# Patient Record
Sex: Female | Born: 2000
Health system: Southern US, Community
[De-identification: ages and names within clinical notes are randomized; demographics above are authoritative.]

## PROBLEM LIST (undated history)

## (undated) HISTORY — PX: NO PAST SURGERIES: SHX2092

---
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
## 2018-06-17 ENCOUNTER — Ambulatory Visit: Payer: Self-pay | Attending: Family Medicine | Admitting: Family Medicine

## 2018-06-17 ENCOUNTER — Encounter: Payer: Self-pay | Admitting: Family Medicine

## 2018-06-17 ENCOUNTER — Other Ambulatory Visit: Payer: Self-pay

## 2018-06-17 VITALS — BP 110/78 | HR 56 | Temp 98.6°F | Resp 18 | Ht 64.0 in | Wt 130.2 lb

## 2018-06-17 DIAGNOSIS — R42 Dizziness and giddiness: Secondary | ICD-10-CM

## 2018-06-17 DIAGNOSIS — Z23 Encounter for immunization: Secondary | ICD-10-CM

## 2018-06-17 DIAGNOSIS — R630 Anorexia: Secondary | ICD-10-CM

## 2018-06-17 NOTE — Patient Instructions (Signed)

## 2018-06-17 NOTE — Progress Notes (Signed)
Subjective:    Patient ID: Jennifer Maxwell, female    DOB: 02-06-2001, 17 y.o.   MRN: [id number]  HPI  17 yo female new to the practice. Patient presents to establish care. Patient states that she visited her older sister in [place] from May through June (stayed for 3 months and returned on July 31st). Patient has noticed since she returned that she is usually not hungry. Patient states that she does not have an appetite. Patient has also noticed some dizziness at times with changes in position such as from sitting to standing.       When patient initially arrived in [place] she started having daily headaches and her sister took her to the doctor. Patient states that she was told that her headaches were due to dehydration and that she was not used to the heat. Patient did not have any GI issues while in [place]. Patient denies any abdominal pain. No nausea, vomiting, diarrhea or constipation. Patient thinks that she has lost weight but she is not sure over what time period or how much but she knows that this time last year she was 160 pounds. Patient denies any anxiety or depression. She does not smoke cigarettes but occasionally uses a vape pen. 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  Patient denies any significant medical history other than possible grandparent with diabetes.  Patient denies any prior surgery  Review of Systems  Constitutional: Positive for appetite change, fatigue (mild fatigue) and unexpected weight change.  HENT: Negative for sore throat and trouble swallowing.   Respiratory: Negative for cough and shortness of breath.   Cardiovascular: Negative for chest pain, palpitations and leg swelling.  Gastrointestinal: Negative for abdominal pain and nausea.  Genitourinary: Negative for dysuria and frequency.  Musculoskeletal: Negative for back pain, gait problem and myalgias.  Neurological: Positive for  dizziness. Negative for headaches.       Objective:   Physical Exam  Constitutional: She is oriented to person, place, and time. She appears well-developed and well-nourished. No distress.  HENT:  Right Ear: Tympanic membrane, external ear and ear canal normal.  Left Ear: Tympanic membrane, external ear and ear canal normal.  Nose: Nose normal.  Mouth/Throat: Oropharynx is clear and moist.  Eyes: Pupils are equal, round, and reactive to light. Conjunctivae and EOM are normal.  Cardiovascular: Normal rate and regular rhythm.  Pulmonary/Chest: Effort normal and breath sounds normal.  Abdominal: Soft. Bowel sounds are normal. There is no tenderness.  Musculoskeletal: Normal range of motion. She exhibits no edema or tenderness.  Neurological: She is alert and oriented to person, place, and time. No cranial nerve deficit.  Skin:  Normal, no rash or skin changes  Vitals reviewed. BP 110/78 (BP Location: Left Arm, Patient Position: Sitting, Cuff Size: Normal)   Pulse (!) 56   Temp 98.6 F (37 C) (Oral)   Resp 18   Ht 5\' 4"  (1.626 m)   Wt 130 lb 3.2 oz (59.1 kg)   SpO2 100%   BMI 22.35 kg/m       Assessment & Plan:  1. Dizziness Patient with complaint of some dizziness as well as loss of appetite since returning from visiting her sister in [place].  Patient states that while she was in [place] she was diagnosed with dehydration.  Patient will have CMP to look for liver or electrolyte disorder, CBC to look for anemia or possible infection of white blood  cell count is elevated and patient will have TSH in follow-up of loss of appetite.  Patient will be notified of the results and if any further follow-up is needed based on the results.  Patient should return to clinic of her dizziness does not resolve over the next few weeks.  Discussed drinking fluids such as water as well as a mixture of water and Gatorade to help replace lost fluids/electrolytes. - Comprehensive metabolic panel - CBC  with Differential - TSH  2. Loss of appetite for more than 2 weeks Patient with complaint of loss of appetite/decreased appetite since returning from [place].  Patient denies any other GI symptoms.  Patient states that she was seen by Dr. in [place] secondary to headache and was diagnosed with heat illness/dehydration.  Patient has had no nausea/vomiting/diarrhea or constipation.  No heartburn and no abdominal pain.  Patient will have blood work including CMP, CBC and TSH in follow-up of her loss of appetite.  Patient should monitor her weight and return to clinic for weight recheck.  Patient is reminded to try and eat on a regular schedule at least every 6 hours but should return to clinic if she has continued loss of appetite and weight loss.  Patient with occasional use of a vaping pen and patient was encouraged to completely abstain from use of this device. - Comprehensive metabolic panel - CBC with Differential - TSH  3. Need for immunization against influenza Patient was offered influenza immunization which she agreed to receive at today's visit and patient was given information regarding the vaccine in a handout as well.  - Flu Vaccine QUAD 36+ mos IM  An After Visit Summary was printed and given to the patient.  Return if symptoms worsen or fail to improve, for follow-up if not feeling better; schedule well exam at your convience.

## 2018-06-18 LAB — CBC WITH DIFFERENTIAL/PLATELET
Basophils Absolute: 0.1 x10E3/uL (ref 0.0–0.2)
Basos: 1 %
EOS (ABSOLUTE): 0.2 x10E3/uL (ref 0.0–0.4)
Eos: 3 %
Hematocrit: 42.2 % (ref 34.0–46.6)
Hemoglobin: 14.3 g/dL (ref 11.1–15.9)
Immature Grans (Abs): 0 x10E3/uL (ref 0.0–0.1)
Immature Granulocytes: 0 %
Lymphocytes Absolute: 3.1 x10E3/uL (ref 0.7–3.1)
Lymphs: 44 %
MCH: 30.6 pg (ref 26.6–33.0)
MCHC: 33.9 g/dL (ref 31.5–35.7)
MCV: 90 fL (ref 79–97)
Monocytes Absolute: 0.4 x10E3/uL (ref 0.1–0.9)
Monocytes: 6 %
Neutrophils Absolute: 3.2 x10E3/uL (ref 1.4–7.0)
Neutrophils: 46 %
Platelets: 199 x10E3/uL (ref 150–450)
RBC: 4.67 x10E6/uL (ref 3.77–5.28)
RDW: 13.6 % (ref 12.3–15.4)
WBC: 7 x10E3/uL (ref 3.4–10.8)

## 2018-06-18 LAB — COMPREHENSIVE METABOLIC PANEL WITH GFR
ALT: 10 IU/L (ref 0–32)
AST: 18 IU/L (ref 0–40)
Albumin/Globulin Ratio: 2 (ref 1.2–2.2)
Albumin: 5.2 g/dL (ref 3.5–5.5)
Alkaline Phosphatase: 65 IU/L (ref 43–101)
BUN/Creatinine Ratio: 12 (ref 9–23)
BUN: 8 mg/dL (ref 6–20)
Bilirubin Total: 0.5 mg/dL (ref 0.0–1.2)
CO2: 19 mmol/L — ABNORMAL LOW (ref 20–29)
Calcium: 10 mg/dL (ref 8.7–10.2)
Chloride: 104 mmol/L (ref 96–106)
Creatinine, Ser: 0.65 mg/dL (ref 0.57–1.00)
GFR calc Af Amer: 150 mL/min/1.73
GFR calc non Af Amer: 130 mL/min/1.73
Globulin, Total: 2.6 g/dL (ref 1.5–4.5)
Glucose: 78 mg/dL (ref 65–99)
Potassium: 4.2 mmol/L (ref 3.5–5.2)
Sodium: 142 mmol/L (ref 134–144)
Total Protein: 7.8 g/dL (ref 6.0–8.5)

## 2018-06-18 LAB — TSH: TSH: 5.08 u[IU]/mL — ABNORMAL HIGH (ref 0.450–4.500)

## 2018-06-22 ENCOUNTER — Telehealth: Payer: Self-pay

## 2018-06-22 NOTE — Telephone Encounter (Signed)
[employer] interpreter Angelica [id number]. Call was placed to patient, no answer. Interpreter lvm for patient to return call. If patient returns call please  Notify patient that her CMP and CBC are normal. She has a very mild increase in her TSH but since it is very near normal, I would suggest just repeating the TSH in about 6 months. No treatment warranted based on today's result.

## 2018-06-22 NOTE — Telephone Encounter (Signed)
-----   Message from Cain Saupeammie Fulp, MD sent at 06/20/2018  6:52 PM EDT ----- Notify patient that her CMP and CBC are normal. She has a very mild increase in her TSH but since it is very near normal, I would suggest just repeating the TSH in about 6 months. No treatment warranted based on today's result.

## 2018-12-08 ENCOUNTER — Inpatient Hospital Stay (HOSPITAL_COMMUNITY): Admit: 2018-12-08 | Payer: Self-pay

## 2018-12-08 ENCOUNTER — Ambulatory Visit: Payer: Self-pay | Attending: Family Medicine | Admitting: Physician Assistant

## 2018-12-08 VITALS — BP 107/73 | HR 73 | Temp 98.8°F | Resp 16 | Ht 64.0 in | Wt 138.0 lb

## 2018-12-08 DIAGNOSIS — R319 Hematuria, unspecified: Secondary | ICD-10-CM

## 2018-12-08 DIAGNOSIS — N3000 Acute cystitis without hematuria: Secondary | ICD-10-CM

## 2018-12-08 LAB — POCT URINALYSIS DIP (CLINITEK)
BILIRUBIN UA: NEGATIVE mg/dL
Bilirubin, UA: NEGATIVE
GLUCOSE UA: NEGATIVE mg/dL
Leukocytes, UA: NEGATIVE
Nitrite, UA: NEGATIVE
POC PROTEIN,UA: NEGATIVE
Spec Grav, UA: <=1.005 — AB (ref 1.010–1.025)
Urobilinogen, UA: 0.2 E.U./dL
pH, UA: 6 (ref 5.0–8.0)

## 2018-12-08 NOTE — Patient Instructions (Signed)
Drink 80-100 ounces of water daily

## 2018-12-08 NOTE — Progress Notes (Signed)
Patient would like to check to see if she still have UTI. Pt. Stated she feels uncomfortable.   Pt. Stated she finished her Antibiotic she got from the urgent for UTI.

## 2018-12-08 NOTE — Progress Notes (Signed)
Patient ID: Jennifer Maxwell, female   DOB: 2001-04-15, 18 y.o.   MRN: [id number]      Jennifer Maxwell, is a 18 y.o. female  HOO:[id number]  [id number]  DOB - 08-23-2001  Subjective:  Chief Complaint and HPI: Jennifer Maxwell is a 18 y.o. female here today for a follow up visit after going to a non-[facility]gent [facility] and treated for UTI.  She was given 5 days of antibiotics.  Finished the meds 6 days ago.  Says things "still don't feel right."  She doesn't know which urgent care she went to or what meds she was given.  She denies dysuria and says that overall her symptoms are gone.  LMP about 2.5 weeks ago.  No f/c.  No pelvic pain.  No abdominal pain.  No concern for STI.  No vaginal discharge  ROS:   Constitutional:  No f/c, No night sweats, No unexplained weight loss. EENT:  No vision changes, No blurry vision, No hearing changes. No mouth, throat, or ear problems.  Respiratory: No cough, No SOB Cardiac: No CP, no palpitations GI:  No abd pain, No N/V/D. GU: see above Musculoskeletal: No joint pain Neuro: No headache, no dizziness, no motor weakness.  Skin: No rash Endocrine:  No polydipsia. No polyuria.  Psych: Denies SI/HI  No problems updated.  ALLERGIES: Not on File  PAST MEDICAL HISTORY: History reviewed. No pertinent past medical history.  MEDICATIONS AT HOME: Prior to Admission medications   Medication Sig Start Date End Date Taking? Authorizing Provider  ibuprofen (ADVIL,MOTRIN) 200 MG tablet Take 200 mg by mouth every 6 (six) hours as needed.   Yes [provider]     Objective:  EXAM:   Vitals:   12/08/18 0849  BP: 107/73  Pulse: 73  Resp: 16  Temp: 98.8 F (37.1 C)  TempSrc: Oral  SpO2: 97%  Weight: 138 lb (62.6 kg)  Height: 5\' 4"  (1.626 m)    General appearance : A&OX3. NAD. Non-toxic-appearing HEENT: Atraumatic and Normocephalic.  PERRLA. EOM intact.   Chest/Lungs:  Breathing-non-labored, Good air  entry bilaterally, breath sounds normal without rales, rhonchi, or wheezing  CVS: S1 S2 regular, no murmurs, gallops, rubs  No CVA TTP Extremities: Bilateral Lower Ext shows no edema, both legs are warm to touch with = pulse throughout Neurology:  CN II-XII grossly intact, Non focal.   Psych:  TP linear. J/I WNL. Normal speech. Appropriate eye contact and affect.  Skin:  No Rash  Data Review No results found for: HGBA1C   Assessment & Plan   1. Hematuria, unspecified type Will assess for further infection-she may be developing yeast infection s/p antibiotics.  Will await test results.  Drink 80-100 ounces water daily.   - POCT URINALYSIS DIP (CLINITEK) - Urine cytology ancillary only - Urine Culture     Patient have been counseled extensively about nutrition and exercise  Return if symptoms worsen or fail to improve.  The patient was given clear instructions to go to ER or return to medical center if symptoms don't improve, worsen or new problems develop. The patient verbalized understanding. The patient was told to call to get lab results if they haven't heard anything in the next week.     Georgian Co, PA-C [facility] [place], [place] [phone]   12/08/2018, 9:10 AM

## 2018-12-09 LAB — URINE CYTOLOGY ANCILLARY ONLY
Chlamydia: NEGATIVE
Neisseria Gonorrhea: NEGATIVE
Trichomonas: NEGATIVE

## 2018-12-10 LAB — URINE CULTURE

## 2018-12-13 ENCOUNTER — Telehealth: Payer: Self-pay | Admitting: *Deleted

## 2018-12-13 LAB — URINE CYTOLOGY ANCILLARY ONLY
Bacterial vaginitis: NEGATIVE
Candida vaginitis: NEGATIVE

## 2018-12-13 NOTE — Telephone Encounter (Signed)
-----   Message from Anders Simmonds, [place] sent at 12/13/2018 12:12 PM EST ----- Please call patient.  All urine test was negative for STD infection, yeast, BV, or any other infection.  Follow-up as planned.  Thanks, Anne Ng

## 2018-12-13 NOTE — Telephone Encounter (Signed)
Patient verified DOB Patient is aware of no infection or concerns being present in the urine. No further questions.

## 2019-12-28 ENCOUNTER — Encounter: Payer: Self-pay | Admitting: Family Medicine

## 2019-12-28 ENCOUNTER — Other Ambulatory Visit: Payer: Self-pay

## 2019-12-28 ENCOUNTER — Ambulatory Visit: Payer: Self-pay | Attending: Family Medicine | Admitting: Family Medicine

## 2019-12-28 VITALS — BP 105/65 | HR 81 | Ht 64.0 in | Wt 134.6 lb

## 2019-12-28 DIAGNOSIS — R319 Hematuria, unspecified: Secondary | ICD-10-CM

## 2019-12-28 DIAGNOSIS — M545 Low back pain, unspecified: Secondary | ICD-10-CM

## 2019-12-28 DIAGNOSIS — G8929 Other chronic pain: Secondary | ICD-10-CM

## 2019-12-28 MED ORDER — NAPROXEN 500 MG PO TABS: 500.0000 mg | ORAL_TABLET | Freq: Two times a day (BID) | ORAL | 1 refills | Status: DC

## 2019-12-28 MED ORDER — CYCLOBENZAPRINE HCL 5 MG PO TABS: 5.0000 mg | ORAL_TABLET | Freq: Three times a day (TID) | ORAL | 0 refills | Status: DC | PRN

## 2019-12-28 NOTE — Patient Instructions (Signed)

## 2019-12-28 NOTE — Progress Notes (Signed)
Subjective:  Patient ID: Jennifer Maxwell, female    DOB: 2001/10/10  Age: 19 y.o. MRN: [id number]  CC: Back Pain   HPI Jennifer Maxwell, 19 year old female, who was seen secondary to the complaint of low back pain and patient with visit on 12/08/2018 in follow-up of urinary tract infection which was diagnosed at an urgent care.  At her last visit, she had repeat urinalysis, urine cytology ancillary and urine culture.  She had negative urine cytology ancillary testing, no signs of infection.  Urine culture showed mixed urogenital flora, however her urinalysis showed moderate blood.  She is here to repeat urinalysis in follow-up of blood in the urine at her last visit, as well as current low back pain and prior urinary tract infection.  She denies any current urinary frequency or dysuria at today's visit.  She has seen no visible blood in the urine.  She has had recurrent issues with right-sided low back pain.  Pain is a dull, aching sensation and she does not have any radiation of the pain into her buttocks or down her leg.  Use of over-the-counter medication does decrease the pain.  Pain is about a 6 on a 0-to-10 scale and is worse with movement and sometimes worse when lying flat on her back.  History reviewed. No pertinent past medical history.  Past Surgical History:  Procedure Laterality Date  . NO PAST SURGERIES      Family History  Problem Relation Age of Onset  . Diabetes Maternal Grandmother   . Hypertension Neg Hx   . Cancer Neg Hx   . Heart disease Neg Hx     Social History   Tobacco Use  . Smoking status: Current Every Day Smoker    Types: E-cigarettes  . Smokeless tobacco: Never Used  . Tobacco comment: Vape  Substance Use Topics  . Alcohol use: Never    ROS Review of Systems  Constitutional: Positive for fatigue (Occasional). Negative for chills and fever.  HENT: Negative for sore throat and trouble swallowing.   Respiratory: Negative for cough  and shortness of breath.   Cardiovascular: Negative for chest pain and palpitations.  Gastrointestinal: Negative for abdominal pain, blood in stool, constipation, diarrhea and nausea.  Endocrine: Negative for cold intolerance, heat intolerance, polydipsia, polyphagia and polyuria.  Genitourinary: Negative for dysuria, flank pain and hematuria.  Musculoskeletal: Positive for arthralgias and back pain.  Skin: Negative for rash and wound.  Neurological: Negative for dizziness and headaches.  Hematological: Negative for adenopathy. Does not bruise/bleed easily.    Objective:   Today's Vitals: BP 105/65   Pulse 81   Ht 5\' 4"  (1.626 m)   Wt 134 lb 9.6 oz (61.1 kg)   SpO2 99%   BMI 23.10 kg/m   Physical Exam Constitutional:      Appearance: Normal appearance.  Cardiovascular:     Rate and Rhythm: Normal rate and regular rhythm.  Pulmonary:     Effort: Pulmonary effort is normal.     Breath sounds: Normal breath sounds.  Abdominal:     Palpations: Abdomen is soft.     Tenderness: There is no abdominal tenderness. There is no right CVA tenderness, left CVA tenderness, guarding or rebound.  Musculoskeletal:        General: Tenderness (Mild right central to lateral low back pain) present.     Right lower leg: No edema.     Left lower leg: No edema.     Comments: Bilateral lumbar paraspinous spasm  right greater than left  Skin:    General: Skin is warm and dry.  Neurological:     General: No focal deficit present.     Mental Status: She is alert and oriented to person, place, and time.  Psychiatric:        Mood and Affect: Mood normal.        Behavior: Behavior normal.     Assessment & Plan:  1. Hematuria, unspecified type Patient was unable to give urine sample at today's visit and was asked to return at her convenience to perform urinalysis and follow-up of prior hematuria. - Urinalysis; Future  2. Chronic right-sided low back pain without sciatica Prescription provided  for naproxen which patient may take twice daily after meals as needed for back pain and prescription for cyclobenzaprine which she may take 5 mg every 8 hours as needed and up to 2 pills at bedtime to help with muscle spasm as well as initiation of sleep.  Handout on back pain provided as part of after visit summary. - naproxen (NAPROSYN) 500 MG tablet; Take 1 tablet (500 mg total) by mouth 2 (two) times daily with a meal. As needed for pain  Dispense: 60 tablet; Refill: 1 - cyclobenzaprine (FLEXERIL) 5 MG tablet; Take 1 tablet (5 mg total) by mouth 3 (three) times daily as needed for muscle spasms. May take up to 2 pills at bedtime if needed  Dispense: 30 tablet; Refill: 0  Outpatient Encounter Medications as of 12/28/2019  Medication Sig  . cyclobenzaprine (FLEXERIL) 5 MG tablet Take 1 tablet (5 mg total) by mouth 3 (three) times daily as needed for muscle spasms. May take up to 2 pills at bedtime if needed  . ibuprofen (ADVIL,MOTRIN) 200 MG tablet Take 200 mg by mouth every 6 (six) hours as needed.  . naproxen (NAPROSYN) 500 MG tablet Take 1 tablet (500 mg total) by mouth 2 (two) times daily with a meal. As needed for pain   No facility-administered encounter medications on file as of 12/28/2019.    An After Visit Summary was printed and given to the patient.   Follow-up: Return for back pain- f/u as needed and schedule lab visit for UA.    Cain Saupe MD

## 2019-12-29 MED FILL — CYCLOBENZAPRINE 5 MG TABLET: 5 | 10 days supply | Qty: 30 | Fill #0

## 2019-12-29 MED FILL — NAPROXEN 500 MG TABLET: 500 | 30 days supply | Qty: 60 | Fill #0

## 2020-01-03 ENCOUNTER — Ambulatory Visit: Payer: Self-pay

## 2020-01-05 ENCOUNTER — Ambulatory Visit: Payer: Self-pay | Attending: Family Medicine

## 2020-01-05 ENCOUNTER — Other Ambulatory Visit: Payer: Self-pay

## 2020-01-05 DIAGNOSIS — R319 Hematuria, unspecified: Secondary | ICD-10-CM

## 2020-01-05 NOTE — Progress Notes (Signed)
Pt is here for lab urinalysis as instructed by PCP / DOB and name verified/   Urine sample was collected and sent out for lab processing/ ( Made Rosey Bath aware urine needs to be sent out) Instructed pt to f /u results within few days trough my chart or call to call office at [phone] / verbalized understanding

## 2020-01-05 NOTE — Addendum Note (Signed)
Addended byMemory Dance on: 01/05/2020 04:56 PM   Modules accepted: Orders

## 2020-01-06 LAB — URINALYSIS
Bilirubin, UA: NEGATIVE
Glucose, UA: NEGATIVE
Leukocytes,UA: NEGATIVE
Nitrite, UA: NEGATIVE
Protein,UA: NEGATIVE
Specific Gravity, UA: 1.019 (ref 1.005–1.030)
Urobilinogen, Ur: 0.2 mg/dL (ref 0.2–1.0)
pH, UA: 6.5 (ref 5.0–7.5)

## 2020-01-10 ENCOUNTER — Other Ambulatory Visit: Payer: Self-pay | Admitting: Family Medicine

## 2020-01-10 DIAGNOSIS — R319 Hematuria, unspecified: Secondary | ICD-10-CM

## 2020-01-10 NOTE — Progress Notes (Signed)
Patient ID: Jennifer Maxwell, female   DOB: [birth year], 18 y.o.   MRN: [id number]   19 year old female with hematuria on urinalysis.  Patient aware that referral will be placed to urology for further evaluation and treatment

## 2020-02-01 ENCOUNTER — Encounter: Payer: Self-pay | Admitting: Family Medicine

## 2020-02-01 DIAGNOSIS — R319 Hematuria, unspecified: Secondary | ICD-10-CM | POA: Insufficient documentation

## 2020-02-01 DIAGNOSIS — M545 Low back pain, unspecified: Secondary | ICD-10-CM | POA: Insufficient documentation

## 2020-02-01 DIAGNOSIS — G8929 Other chronic pain: Secondary | ICD-10-CM | POA: Insufficient documentation

## 2020-12-25 ENCOUNTER — Telehealth: Payer: Self-pay | Admitting: Physician Assistant

## 2021-04-23 ENCOUNTER — Ambulatory Visit (INDEPENDENT_AMBULATORY_CARE_PROVIDER_SITE_OTHER): Payer: 59 | Admitting: Family Medicine

## 2021-04-23 ENCOUNTER — Other Ambulatory Visit: Payer: Self-pay

## 2021-04-23 ENCOUNTER — Encounter: Payer: Self-pay | Admitting: Family Medicine

## 2021-04-23 VITALS — BP 110/68 | HR 75 | Temp 97.4°F | Ht 65.0 in | Wt 157.4 lb

## 2021-04-23 DIAGNOSIS — R7989 Other specified abnormal findings of blood chemistry: Secondary | ICD-10-CM | POA: Diagnosis not present

## 2021-04-23 DIAGNOSIS — Z0000 Encounter for general adult medical examination without abnormal findings: Secondary | ICD-10-CM

## 2021-04-23 LAB — LIPID PANEL
Cholesterol: 122 mg/dL (ref 0–200)
HDL: 40.2 mg/dL (ref >39.00)
LDL Cholesterol: 60 mg/dL (ref 0–99)
NonHDL: 81.77
Total CHOL/HDL Ratio: 3
Triglycerides: 111 mg/dL (ref 0.0–149.0)
VLDL: 22.2 mg/dL (ref 0.0–40.0)

## 2021-04-23 LAB — CBC
HCT: 41.5 % (ref 36.0–46.0)
Hemoglobin: 14 g/dL (ref 12.0–15.0)
MCHC: 33.6 g/dL (ref 30.0–36.0)
MCV: 90.8 fl (ref 78.0–100.0)
Platelets: 186 10*3/uL (ref 150.0–400.0)
RBC: 4.57 Mil/uL (ref 3.87–5.11)
RDW: 13.1 % (ref 11.5–15.5)
WBC: 8.9 10*3/uL (ref 4.0–10.5)

## 2021-04-23 LAB — COMPREHENSIVE METABOLIC PANEL
ALT: 18 U/L (ref 0–35)
AST: 19 U/L (ref 0–37)
Albumin: 4.2 g/dL (ref 3.5–5.2)
Alkaline Phosphatase: 36 U/L — ABNORMAL LOW (ref 39–117)
BUN: 6 mg/dL (ref 6–23)
CO2: 24 mEq/L (ref 19–32)
Calcium: 9.4 mg/dL (ref 8.4–10.5)
Chloride: 105 mEq/L (ref 96–112)
Creatinine, Ser: 0.48 mg/dL (ref 0.40–1.20)
GFR: 135.06 mL/min (ref >60.00)
Glucose, Bld: 75 mg/dL (ref 70–99)
Potassium: 4.7 mEq/L (ref 3.5–5.1)
Sodium: 138 mEq/L (ref 135–145)
Total Bilirubin: 0.3 mg/dL (ref 0.2–1.2)
Total Protein: 6.9 g/dL (ref 6.0–8.3)

## 2021-04-23 LAB — TSH: TSH: 2.22 u[IU]/mL (ref 0.35–5.50)

## 2021-04-23 LAB — HEMOGLOBIN A1C: Hgb A1c MFr Bld: 5.1 % (ref 4.6–6.5)

## 2021-04-23 LAB — URINALYSIS, ROUTINE W REFLEX MICROSCOPIC
Bilirubin Urine: NEGATIVE
Ketones, ur: NEGATIVE
Leukocytes,Ua: NEGATIVE
Nitrite: NEGATIVE
Specific Gravity, Urine: 1.02 (ref 1.000–1.030)
Total Protein, Urine: NEGATIVE
Urine Glucose: NEGATIVE
Urobilinogen, UA: 0.2 (ref 0.0–1.0)
pH: 8 (ref 5.0–8.0)

## 2021-04-23 NOTE — Progress Notes (Signed)
New Patient Office Visit  Subjective:  Patient ID: Jennifer Maxwell, female    DOB: 2001/01/01  Age: 20 y.o. MRN: [id number]  CC:  Chief Complaint  Patient presents with   Establish Care    NP/establish care, no concerns. Patient fasting for labs.     HPI Jennifer Maxwell presents for establishment of care and a physical exam.  Patient is healthy as far she knows.  She has no complaints.  G0 P0.  She is sexually active.  She uses a birth control patch.  She is a Consulting civil engineer at [employer].  She is also working full-time at a Engineer, building services.  She lives with her mother.  She uses e-cigarettes.  Mom has a history of hypothyroidism.  Family history of diabetes.  Patient's TSH was mildly elevated 3 years ago.  She admits some difficulty with weight gain recently.  There has been no constipation cold intolerance or hair loss.  History reviewed. No pertinent past medical history.  Past Surgical History:  Procedure Laterality Date   NO PAST SURGERIES      Family History  Problem Relation Age of Onset   Thyroid disease Mother    Healthy Father    Diabetes Maternal Grandmother    Hypertension Neg Hx    Cancer Neg Hx    Heart disease Neg Hx     Social History   Socioeconomic History   Marital status: Single    Spouse name: Not on file   Number of children: Not on file   Years of education: Not on file   Highest education level: Not on file  Occupational History   Not on file  Tobacco Use   Smoking status: Every Day    Pack years: 0.00    Types: E-cigarettes   Smokeless tobacco: Never   Tobacco comments:    Vape  Vaping Use   Vaping Use: Some days  Substance and Sexual Activity   Alcohol use: Yes    Alcohol/week: 1.0 standard drink    Types: 1 Standard drinks or equivalent per week    Comment: social   Drug use: Never   Sexual activity: Yes    Birth control/protection: Patch  Other Topics Concern   Not on file   Social History Narrative   Not on file   Social Determinants of Health   Financial Resource Strain: Not on file  Food Insecurity: Not on file  Transportation Needs: Not on file  Physical Activity: Not on file  Stress: Not on file  Social Connections: Not on file  Intimate Partner Violence: Not on file    ROS Review of Systems  Constitutional:  Positive for unexpected weight change. Negative for chills, diaphoresis, fatigue and fever.  HENT: Negative.    Eyes:  Negative for photophobia and visual disturbance.  Respiratory: Negative.    Cardiovascular: Negative.   Gastrointestinal: Negative.   Endocrine: Negative for cold intolerance and heat intolerance.  Genitourinary: Negative.   Skin:  Negative for pallor and rash.  Neurological: Negative.   Psychiatric/Behavioral: Negative.    Depression screen [facility] 2/9 04/23/2021 04/23/2021 12/28/2019  Decreased Interest 0 0 0  Down, Depressed, Hopeless 0 0 0  PHQ - 2 Score 0 0 0  Altered sleeping 0 - 0  Tired, decreased energy 1 - 0  Change in appetite 1 - 0  Feeling bad or failure about yourself  0 - 0  Trouble concentrating 0 - 0  Moving slowly  or fidgety/restless 0 - 0  Suicidal thoughts 0 - 0  PHQ-9 Score 2 - 0  Difficult doing work/chores Somewhat difficult - -     Objective:   Today's Vitals: BP 110/68   Pulse 75   Temp (!) 97.4 F (36.3 C) (Temporal)   Ht 5\' 5"  (1.651 m)   Wt 157 lb 6.4 oz (71.4 kg)   SpO2 97%   BMI 26.19 kg/m   Physical Exam Vitals and nursing note reviewed.  Constitutional:      General: She is not in acute distress.    Appearance: Normal appearance. She is not ill-appearing, toxic-appearing or diaphoretic.  HENT:     Head: Normocephalic and atraumatic.     Right Ear: Tympanic membrane, ear canal and external ear normal.     Left Ear: Tympanic membrane, ear canal and external ear normal.     Mouth/Throat:     Mouth: Mucous membranes are moist.     Pharynx: Oropharynx is clear. No  oropharyngeal exudate or posterior oropharyngeal erythema.  Eyes:     General: No scleral icterus.       Right eye: No discharge.        Left eye: No discharge.     Extraocular Movements: Extraocular movements intact.     Conjunctiva/sclera: Conjunctivae normal.     Pupils: Pupils are equal, round, and reactive to light.  Neck:     Thyroid: No thyroid mass, thyromegaly or thyroid tenderness.     Vascular: No carotid bruit.  Cardiovascular:     Rate and Rhythm: Normal rate and regular rhythm.  Pulmonary:     Effort: Pulmonary effort is normal.     Breath sounds: Normal breath sounds.  Abdominal:     General: Abdomen is flat. Bowel sounds are normal. There is no distension.     Palpations: Abdomen is soft. There is no mass.     Tenderness: There is no abdominal tenderness. There is no guarding or rebound.     Hernia: No hernia is present.  Musculoskeletal:     Cervical back: No rigidity or tenderness.  Lymphadenopathy:     Cervical: No cervical adenopathy.  Skin:    General: Skin is warm and dry.  Neurological:     Mental Status: She is alert and oriented to person, place, and time.  Psychiatric:        Mood and Affect: Mood normal.        Behavior: Behavior normal.    Assessment & Plan:   Problem List Items Addressed This Visit   None Visit Diagnoses     Healthcare maintenance    -  Primary   Relevant Orders   CBC   Comprehensive metabolic panel   Lipid panel   Hemoglobin A1c   Urinalysis, Routine w reflex microscopic   Ambulatory referral to Obstetrics / Gynecology   HIV Antibody (routine testing w rflx)   Elevated TSH       Relevant Orders   TSH       Outpatient Encounter Medications as of 04/23/2021  Medication Sig   XULANE 150-35 MCG/24HR transdermal patch 1 patch once a week.   [DISCONTINUED] cyclobenzaprine (FLEXERIL) 5 MG tablet Take 1 tablet (5 mg total) by mouth 3 (three) times daily as needed for muscle spasms. May take up to 2 pills at bedtime if  needed   [DISCONTINUED] ibuprofen (ADVIL,MOTRIN) 200 MG tablet Take 200 mg by mouth every 6 (six) hours as needed.   [DISCONTINUED] naproxen (NAPROSYN)  500 MG tablet Take 1 tablet (500 mg total) by mouth 2 (two) times daily with a meal. As needed for pain   No facility-administered encounter medications on file as of 04/23/2021.    Follow-up: Return Recommended follow-up will pend the results of today's labs.   Given information on health maintenance and disease prevention and referred to GYN for well woman care.  Discussed the Gardasil vaccine.  Patient prefers to discuss this with GYN provider.  Given information on hypothyroidism.  We will start treatment if repeat TSH is elevated.  Advised patient to discontinue e-cigarette use.  Mliss Sax, MD

## 2021-04-24 LAB — HIV ANTIBODY (ROUTINE TESTING W REFLEX): HIV 1&2 Ab, 4th Generation: NONREACTIVE

## 2021-06-18 ENCOUNTER — Encounter: Payer: 59 | Admitting: Certified Nurse Midwife

## 2021-08-01 ENCOUNTER — Encounter: Payer: 59 | Admitting: Certified Nurse Midwife

## 2021-11-03 ENCOUNTER — Encounter: Payer: Self-pay | Admitting: Family Medicine

## 2021-11-03 ENCOUNTER — Other Ambulatory Visit: Payer: Self-pay

## 2021-11-03 ENCOUNTER — Ambulatory Visit (INDEPENDENT_AMBULATORY_CARE_PROVIDER_SITE_OTHER): Payer: 59 | Admitting: Family Medicine

## 2021-11-03 VITALS — BP 116/70 | HR 66 | Temp 96.9°F | Ht 65.0 in | Wt 163.0 lb

## 2021-11-03 DIAGNOSIS — Z3041 Encounter for surveillance of contraceptive pills: Secondary | ICD-10-CM | POA: Insufficient documentation

## 2021-11-03 DIAGNOSIS — Z23 Encounter for immunization: Secondary | ICD-10-CM

## 2021-11-03 MED ORDER — XULANE 150-35 MCG/24HR TD PTWK: 1.0000 | MEDICATED_PATCH | TRANSDERMAL | 12 refills | Status: DC

## 2021-11-03 NOTE — Progress Notes (Signed)
Established Patient Office Visit  Subjective:  Patient ID: Jennifer Maxwell, female    DOB: 2001/06/28  Age: 21 y.o. MRN: [id number]  CC:  Chief Complaint  Patient presents with   Medication Refill    Refill on birth control patch.     HPI Jennifer Maxwell presents for follow-up for birth control refill.  She uses a transdermal patch.  3 weeks on 1 week off.  It is worked well for her.  She has not missed a week.  She has had no issues with the patch.  Requests flu vaccination.  Hopes to finish school attendance this coming summer.  History reviewed. No pertinent past medical history.  Past Surgical History:  Procedure Laterality Date   NO PAST SURGERIES      Family History  Problem Relation Age of Onset   Thyroid disease Mother    Healthy Father    Diabetes Maternal Grandmother    Hypertension Neg Hx    Cancer Neg Hx    Heart disease Neg Hx     Social History   Socioeconomic History   Marital status: Single    Spouse name: Not on file   Number of children: Not on file   Years of education: Not on file   Highest education level: Not on file  Occupational History   Not on file  Tobacco Use   Smoking status: Every Day    Types: E-cigarettes   Smokeless tobacco: Never   Tobacco comments:    Vape  Vaping Use   Vaping Use: Some days  Substance and Sexual Activity   Alcohol use: Yes    Alcohol/week: 1.0 standard drink    Types: 1 Standard drinks or equivalent per week    Comment: social   Drug use: Never   Sexual activity: Yes    Birth control/protection: Patch  Other Topics Concern   Not on file  Social History Narrative   Not on file   Social Determinants of Health   Financial Resource Strain: Not on file  Food Insecurity: Not on file  Transportation Needs: Not on file  Physical Activity: Not on file  Stress: Not on file  Social Connections: Not on file  Intimate Partner Violence: Not on file    Outpatient Medications Prior to  Visit  Medication Sig Dispense Refill   XULANE 150-35 MCG/24HR transdermal patch 1 patch once a week.     No facility-administered medications prior to visit.    No Known Allergies  ROS Review of Systems  Constitutional:  Negative for diaphoresis, fatigue, fever and unexpected weight change.  HENT: Negative.    Eyes:  Negative for photophobia and visual disturbance.  Respiratory: Negative.    Cardiovascular: Negative.   Gastrointestinal: Negative.   Endocrine: Negative for polyphagia and polyuria.  Genitourinary: Negative.  Negative for menstrual problem and pelvic pain.     Objective:    Physical Exam Vitals and nursing note reviewed.  Constitutional:      General: She is not in acute distress.    Appearance: Normal appearance. She is not ill-appearing, toxic-appearing or diaphoretic.  HENT:     Head: Normocephalic and atraumatic.     Right Ear: External ear normal.     Left Ear: External ear normal.     Mouth/Throat:     Mouth: Mucous membranes are moist.     Pharynx: Oropharynx is clear. No oropharyngeal exudate or posterior oropharyngeal erythema.  Eyes:     General:  Right eye: No discharge.        Left eye: No discharge.     Extraocular Movements: Extraocular movements intact.     Conjunctiva/sclera: Conjunctivae normal.     Pupils: Pupils are equal, round, and reactive to light.  Cardiovascular:     Rate and Rhythm: Normal rate and regular rhythm.  Pulmonary:     Effort: Pulmonary effort is normal.     Breath sounds: Normal breath sounds.  Musculoskeletal:     Cervical back: No rigidity or tenderness.     Right lower leg: No edema.     Left lower leg: No edema.  Lymphadenopathy:     Cervical: No cervical adenopathy.  Skin:    General: Skin is warm and dry.  Neurological:     Mental Status: She is alert and oriented to person, place, and time.  Psychiatric:        Mood and Affect: Mood normal.        Behavior: Behavior normal.    BP 116/70 (BP  Location: Right Arm, Patient Position: Sitting, Cuff Size: Normal)    Pulse 66    Temp (!) 96.9 F (36.1 C) (Temporal)    Ht 5\' 5"  (1.651 m)    Wt 163 lb (73.9 kg)    SpO2 97%    BMI 27.12 kg/m  Wt Readings from Last 3 Encounters:  11/03/21 163 lb (73.9 kg)  04/23/21 157 lb 6.4 oz (71.4 kg)  12/28/19 134 lb 9.6 oz (61.1 kg)     Health Maintenance Due  Topic Date Due   Pneumococcal Vaccine 35-69 Years old (1 - PPSV23 if available, else PCV20) 09/30/2001   HPV VACCINES (3 - 3-dose series) 09/22/2016   Hepatitis C Screening  Never done   PAP-Cervical Cytology Screening  Never done   PAP SMEAR-Modifier  Never done       Topic Date Due   HPV VACCINES (3 - 3-dose series) 09/22/2016    Lab Results  Component Value Date   TSH 2.22 04/23/2021   Lab Results  Component Value Date   WBC 8.9 04/23/2021   HGB 14.0 04/23/2021   HCT 41.5 04/23/2021   MCV 90.8 04/23/2021   PLT 186.0 04/23/2021   Lab Results  Component Value Date   NA 138 04/23/2021   K 4.7 04/23/2021   CO2 24 04/23/2021   GLUCOSE 75 04/23/2021   BUN 6 04/23/2021   CREATININE 0.48 04/23/2021   BILITOT 0.3 04/23/2021   ALKPHOS 36 (L) 04/23/2021   AST 19 04/23/2021   ALT 18 04/23/2021   PROT 6.9 04/23/2021   ALBUMIN 4.2 04/23/2021   CALCIUM 9.4 04/23/2021   GFR 135.06 04/23/2021   Lab Results  Component Value Date   CHOL 122 04/23/2021   Lab Results  Component Value Date   HDL 40.20 04/23/2021   Lab Results  Component Value Date   LDLCALC 60 04/23/2021   Lab Results  Component Value Date   TRIG 111.0 04/23/2021   Lab Results  Component Value Date   CHOLHDL 3 04/23/2021   Lab Results  Component Value Date   HGBA1C 5.1 04/23/2021      Assessment & Plan:   Problem List Items Addressed This Visit       Other   Flu vaccine need   Relevant Orders   Flu Vaccine QUAD 6+ mos PF IM (Fluarix Quad PF) (Completed)   Encounter for birth control pills maintenance - Primary   Relevant Medications  XULANE 150-35 MCG/24HR transdermal patch   Other Relevant Orders   Ambulatory referral to Obstetrics / Gynecology    Meds ordered this encounter  Medications   XULANE 150-35 MCG/24HR transdermal patch    Sig: Place 1 patch onto the skin once a week.    Dispense:  3 patch    Refill:  12    Follow-up: Return in about 1 year (around 11/03/2022).  Encouraged follow-up with OB/GYN for Pap smear.  Again she prefers to discuss the Gardasil vaccination with her future GYN provider.  Information was given on the transdermal patch and the Gardasil vaccine.  Libby Maw, MD

## 2022-05-14 ENCOUNTER — Other Ambulatory Visit (HOSPITAL_COMMUNITY)
Admission: RE | Admit: 2022-05-14 | Discharge: 2022-05-14 | Disposition: A | Discharge status: Home / Self Care | Payer: 59 | Source: Ambulatory Visit | Attending: Family Medicine | Admitting: Family Medicine

## 2022-05-14 ENCOUNTER — Ambulatory Visit (INDEPENDENT_AMBULATORY_CARE_PROVIDER_SITE_OTHER): Payer: 59 | Admitting: Family Medicine

## 2022-05-14 ENCOUNTER — Encounter: Payer: Self-pay | Admitting: Family Medicine

## 2022-05-14 VITALS — BP 116/70 | HR 66 | Temp 97.5°F | Ht 65.0 in | Wt 164.4 lb

## 2022-05-14 DIAGNOSIS — N390 Urinary tract infection, site not specified: Secondary | ICD-10-CM

## 2022-05-14 LAB — POCT URINE PREGNANCY: Preg Test, Ur: NEGATIVE

## 2022-05-14 NOTE — Progress Notes (Signed)
Established Patient Office Visit  Subjective   Patient ID: Jennifer Maxwell, female    DOB: 06-06-2001  Age: 21 y.o. MRN: [id number]  Chief Complaint  Patient presents with   Urinary Tract Infection    Had UTI when out of town after taking medication still feeling back pains, pressure and odor to urine. Spotting started 3 days ago.     Urinary Tract Infection  Pertinent negatives include no chills, flank pain, frequency, hematuria or urgency.   follow-up of recent UTI treated with 5 days of Macrobid.  She completed course of therapy but notices that her urine smells strongly.  Symptoms have resolved.  She had back pain but that has resolved.  There is no fever chills nausea or vomiting.  Continues with contraception through the patch.  She is in a stable year-long relationship.  Last UTI before the last was several months ago.  Believes that these are associated with increased sexual activity.    Review of Systems  Constitutional:  Negative for chills, diaphoresis, malaise/fatigue and weight loss.  HENT: Negative.    Eyes: Negative.  Negative for blurred vision and double vision.  Cardiovascular:  Negative for chest pain.  Gastrointestinal:  Negative for abdominal pain.  Genitourinary: Negative.  Negative for dysuria, flank pain, frequency, hematuria and urgency.  Musculoskeletal:  Negative for back pain, falls and myalgias.  Neurological:  Negative for speech change, loss of consciousness and weakness.  Psychiatric/Behavioral: Negative.        Objective:     BP 116/70 (BP Location: Right Arm, Patient Position: Sitting, Cuff Size: Normal)   Pulse 66   Temp (!) 97.5 F (36.4 C) (Temporal)   Ht 5\' 5"  (1.651 m)   Wt 164 lb 6.4 oz (74.6 kg)   SpO2 98%   BMI 27.36 kg/m    Physical Exam Constitutional:      General: She is not in acute distress.    Appearance: Normal appearance. She is not ill-appearing, toxic-appearing or diaphoretic.  HENT:     Head:  Normocephalic and atraumatic.     Right Ear: External ear normal.     Left Ear: External ear normal.  Eyes:     General: No scleral icterus.       Right eye: No discharge.        Left eye: No discharge.     Extraocular Movements: Extraocular movements intact.     Conjunctiva/sclera: Conjunctivae normal.     Pupils: Pupils are equal, round, and reactive to light.  Cardiovascular:     Rate and Rhythm: Normal rate and regular rhythm.  Pulmonary:     Effort: Pulmonary effort is normal. No respiratory distress.     Breath sounds: Normal breath sounds.  Abdominal:     Tenderness: There is no right CVA tenderness or left CVA tenderness.  Musculoskeletal:     Cervical back: No rigidity or tenderness.  Skin:    General: Skin is warm and dry.  Neurological:     Mental Status: She is alert and oriented to person, place, and time.  Psychiatric:        Mood and Affect: Mood normal.        Behavior: Behavior normal.      No results found for any visits on 05/14/22.    The ASCVD Risk score (Arnett DK, et al., 2019) failed to calculate for the following reasons:   The 2019 ASCVD risk score is only valid for ages 40 to 53  Assessment & Plan:   Problem List Items Addressed This Visit       Genitourinary   Postcoital UTI - Primary   Relevant Orders   Urinalysis, Routine w reflex microscopic   Urine Culture   Urine cytology ancillary only   POCT urine pregnancy    Return please follow up with Ob/Gyn.  Advised her to hydrate well with plenty of water and to void before coitus and afterwards.  Mliss Sax, MD

## 2022-05-15 LAB — URINALYSIS, ROUTINE W REFLEX MICROSCOPIC
Bilirubin Urine: NEGATIVE
Ketones, ur: NEGATIVE
Leukocytes,Ua: NEGATIVE
Nitrite: NEGATIVE
Specific Gravity, Urine: 1.01 (ref 1.000–1.030)
Total Protein, Urine: NEGATIVE
Urine Glucose: NEGATIVE
Urobilinogen, UA: 0.2 (ref 0.0–1.0)
WBC, UA: NONE SEEN (ref 0–?)
pH: 7 (ref 5.0–8.0)

## 2022-05-16 LAB — URINE CULTURE
MICRO NUMBER:: 13673210
SPECIMEN QUALITY:: ADEQUATE

## 2022-05-18 LAB — URINE CYTOLOGY ANCILLARY ONLY
Bacterial Vaginitis-Urine: NEGATIVE
Chlamydia: NEGATIVE
Comment: NEGATIVE

## 2022-05-26 ENCOUNTER — Encounter: Payer: Self-pay | Admitting: Family Medicine

## 2022-05-26 MED ORDER — SULFAMETHOXAZOLE-TRIMETHOPRIM 800-160 MG PO TABS: 1.0000 | ORAL_TABLET | Freq: Two times a day (BID) | ORAL | 0 refills | Status: AC

## 2022-05-26 NOTE — Addendum Note (Signed)
Addended by: Andrez Grime on: 05/26/2022 01:19 PM   Modules accepted: Orders

## 2022-07-07 ENCOUNTER — Other Ambulatory Visit: Payer: Self-pay | Admitting: Family Medicine

## 2022-07-07 DIAGNOSIS — Z3041 Encounter for surveillance of contraceptive pills: Secondary | ICD-10-CM

## 2022-09-25 DIAGNOSIS — Z419 Encounter for procedure for purposes other than remedying health state, unspecified: Secondary | ICD-10-CM | POA: Diagnosis not present

## 2022-10-17 ENCOUNTER — Ambulatory Visit (HOSPITAL_COMMUNITY)
Admission: EM | Admit: 2022-10-17 | Discharge: 2022-10-17 | Disposition: A | Discharge status: Home / Self Care | Payer: Medicaid Other | Attending: Emergency Medicine | Admitting: Emergency Medicine

## 2022-10-17 ENCOUNTER — Encounter (HOSPITAL_COMMUNITY): Payer: Self-pay

## 2022-10-17 DIAGNOSIS — J069 Acute upper respiratory infection, unspecified: Secondary | ICD-10-CM | POA: Diagnosis not present

## 2022-10-17 MED ORDER — PROMETHAZINE-DM 6.25-15 MG/5ML PO SYRP: 5.0000 mL | ORAL_SOLUTION | Freq: Four times a day (QID) | ORAL | 0 refills | Status: DC | PRN

## 2022-10-17 MED ORDER — PREDNISONE 20 MG PO TABS: 40.0000 mg | ORAL_TABLET | Freq: Every day | ORAL | 0 refills | Status: DC

## 2022-10-17 MED ORDER — BENZONATATE 100 MG PO CAPS: 100.0000 mg | ORAL_CAPSULE | Freq: Three times a day (TID) | ORAL | 0 refills | Status: DC

## 2022-10-17 NOTE — ED Provider Notes (Signed)
[facility]    CSN: [id number] Arrival date & time: 10/17/22  1122      History   Chief Complaint Chief Complaint  Patient presents with   Cough   Chest Congestion     HPI Jennifer Maxwell is a 21 y.o. female.   Patient presents for evaluation of nasal congestion, rhinorrhea, nonproductive cough and shortness of breath for 5 days.  Shortness of breath is primarily at rest, feels as if she cannot get a deep breath.  Has sputum production occasionally with cough but primarily dry.  No known sick contacts.  Tolerating food and liquids.  Has decreased appetite.  Has attempted use of TheraFlu and with cold cough drops which have been minimally effective.  Denies wheezing, chest pain or tightness, fever, chills or bodyaches.  Denies respiratory history.  Non-smoker.  History reviewed. No pertinent past medical history.  Patient Active Problem List   Diagnosis Date Noted   Postcoital UTI 05/14/2022   Flu vaccine need 11/03/2021   Encounter for birth control pills maintenance 11/03/2021   Hematuria 02/01/2020   Chronic right-sided low back pain without sciatica 02/01/2020    Past Surgical History:  Procedure Laterality Date   NO PAST SURGERIES      OB History   No obstetric history on file.      Home Medications    Prior to Admission medications   Medication Sig Start Date End Date Taking? Authorizing Provider  ZAFEMY 150-35 MCG/24HR transdermal patch APPLY 1 PATCH TOPICALLY TO THE SKIN 1 TIME A WEEK 07/07/22  Yes Mliss Sax, MD    Family History Family History  Problem Relation Age of Onset   Thyroid disease Mother    Healthy Father    Diabetes Maternal Grandmother    Hypertension Neg Hx    Cancer Neg Hx    Heart disease Neg Hx     Social History Social History   Tobacco Use   Smoking status: Some Days    Types: E-cigarettes   Smokeless tobacco: Never   Tobacco comments:    Vape  Vaping Use   Vaping Use: Some days   Substance Use Topics   Alcohol use: Yes    Alcohol/week: 1.0 standard drink of alcohol    Types: 1 Standard drinks or equivalent per week    Comment: social   Drug use: Never     Allergies   Patient has no known allergies.   Review of Systems Review of Systems  Constitutional: Negative.   HENT:  Positive for congestion and rhinorrhea. Negative for dental problem, drooling, ear discharge, ear pain, facial swelling, hearing loss, mouth sores, nosebleeds, postnasal drip, sinus pressure, sinus pain, sneezing, sore throat, tinnitus, trouble swallowing and voice change.   Respiratory:  Positive for cough and shortness of breath. Negative for apnea, choking, chest tightness, wheezing and stridor.   Cardiovascular: Negative.   Gastrointestinal: Negative.      Physical Exam Triage Vital Signs ED Triage Vitals  Enc Vitals Group     BP 10/17/22 1421 110/77     Pulse Rate 10/17/22 1421 78     Resp 10/17/22 1421 16     Temp 10/17/22 1421 98.9 F (37.2 C)     Temp Source 10/17/22 1421 Oral     SpO2 10/17/22 1421 95 %     Weight 10/17/22 1422 160 lb (72.6 kg)     Height 10/17/22 1422 5\' 4"  (1.626 m)     Head Circumference --  Peak Flow --      Pain Score 10/17/22 1419 0     Pain Loc --      Pain Edu? --      Excl. in [place]? --    No data found.  Updated Vital Signs BP 110/77 (BP Location: Left Arm)   Pulse 78   Temp 98.9 F (37.2 C) (Oral)   Resp 16   Ht 5\' 4"  (1.626 m)   Wt 160 lb (72.6 kg)   LMP 09/25/2022 (Approximate)   SpO2 95%   BMI 27.46 kg/m   Visual Acuity Right Eye Distance:   Left Eye Distance:   Bilateral Distance:    Right Eye Near:   Left Eye Near:    Bilateral Near:     Physical Exam Constitutional:      Appearance: Normal appearance. She is normal weight.  HENT:     Right Ear: Tympanic membrane, ear canal and external ear normal.     Left Ear: Tympanic membrane, ear canal and external ear normal.     Nose: Congestion and rhinorrhea present.      Mouth/Throat:     Mouth: Mucous membranes are moist.     Pharynx: Oropharynx is clear.  Eyes:     Extraocular Movements: Extraocular movements intact.  Cardiovascular:     Rate and Rhythm: Normal rate and regular rhythm.     Pulses: Normal pulses.     Heart sounds: Normal heart sounds.  Pulmonary:     Effort: Pulmonary effort is normal.     Breath sounds: Normal breath sounds.  Musculoskeletal:     Cervical back: Normal range of motion and neck supple.  Skin:    General: Skin is warm and dry.  Neurological:     Mental Status: She is alert and oriented to person, place, and time. Mental status is at baseline.  Psychiatric:        Mood and Affect: Mood normal.        Behavior: Behavior normal.      UC Treatments / Results  Labs (all labs ordered are listed, but only abnormal results are displayed) Labs Reviewed - No data to display  EKG   Radiology No results found.  Procedures Procedures (including critical care time)  Medications Ordered in UC Medications - No data to display  Initial Impression / Assessment and Plan / UC Course  I have reviewed the triage vital signs and the nursing notes.  Pertinent labs & imaging results that were available during my care of the patient were reviewed by me and considered in my medical decision making (see chart for details).  Viral URI with cough  Patient is in no signs of distress nor toxic appearing.  Vital signs are stable.  Low suspicion for pneumonia, pneumothorax or bronchitis and therefore will defer imaging.  Viral testing deferred due to timeline of illness.Prescribed prednisone,  Tessalon and Promethazine DM.May use additional over-the-counter medications as needed for supportive care.  May follow-up with urgent care as needed if symptoms persist or worsen.  .   Final Clinical Impressions(s) / UC Diagnoses   Final diagnoses:  None   Discharge Instructions   None    ED Prescriptions   None    PDMP not  reviewed this encounter.   Hans Eden, NP 10/17/22 1447

## 2022-10-17 NOTE — ED Triage Notes (Signed)
Chief Complaint: cough is mainly dry with slight production at times that is green, chest congestion, SOB. No fever or chills. No runny nose or sore throat. No respiratory history   Onset: 10/05/22 starting with fever, cough and body aches. Cough remains   Prescriptions or OTC medications tried: Yes- Theraflu tea; Ricola cough drops     with mild relief  Sick exposure: Yes- cousin but no diagnosis or testing was done   New foods, medications, or products: No  Recent Travel: No

## 2022-10-17 NOTE — Discharge Instructions (Signed)
Your symptoms today are most likely being caused by a virus and should steadily improve in time it can take up to 7 to 10 days before you truly start to see a turnaround however things will get better  Begin prednisone every morning with food for 5 days, this medicine helps to reduce inflammation and irritation and relaxes the airway making it easier for you to breathe, should help the sensation of shortness of breath subside  You may use Tessalon pill every 8 hours to help calm your coughing  You may use cough syrup every 6 hours as needed for additional comfort, be mindful this medication may make you feel drowsy    You can take Tylenol and/or Ibuprofen as needed for fever reduction and pain relief.   For cough: honey 1/2 to 1 teaspoon (you can dilute the honey in water or another fluid).  You can also use guaifenesin and dextromethorphan for cough. You can use a humidifier for chest congestion and cough.  If you don't have a humidifier, you can sit in the bathroom with the hot shower running.      For sore throat: try warm salt water gargles, cepacol lozenges, throat spray, warm tea or water with lemon/honey, popsicles or ice, or OTC cold relief medicine for throat discomfort.   For congestion: take a daily anti-histamine like Zyrtec, Claritin, and a oral decongestant, such as pseudoephedrine.  You can also use Flonase 1-2 sprays in each nostril daily.   It is important to stay hydrated: drink plenty of fluids (water, gatorade/powerade/pedialyte, juices, or teas) to keep your throat moisturized and help further relieve irritation/discomfort.

## 2022-10-26 DIAGNOSIS — Z419 Encounter for procedure for purposes other than remedying health state, unspecified: Secondary | ICD-10-CM | POA: Diagnosis not present

## 2022-11-03 ENCOUNTER — Encounter: Payer: 59 | Admitting: Family Medicine

## 2022-11-04 ENCOUNTER — Encounter: Payer: Medicaid Other | Admitting: Family Medicine

## 2022-11-04 ENCOUNTER — Telehealth: Payer: Self-pay | Admitting: Family Medicine

## 2022-11-04 NOTE — Telephone Encounter (Signed)
Pt was a no show for a physical with Dr Ethelene Hal on 11/04/22, I sent a letter.

## 2022-11-09 ENCOUNTER — Encounter: Payer: Medicaid Other | Admitting: Family Medicine

## 2022-11-11 NOTE — Telephone Encounter (Signed)
1st no show, fee waived, letter sent 

## 2022-11-15 ENCOUNTER — Other Ambulatory Visit: Payer: Self-pay | Admitting: Family Medicine

## 2022-11-15 DIAGNOSIS — Z3041 Encounter for surveillance of contraceptive pills: Secondary | ICD-10-CM

## 2022-11-16 ENCOUNTER — Ambulatory Visit (INDEPENDENT_AMBULATORY_CARE_PROVIDER_SITE_OTHER): Payer: Medicaid Other | Admitting: Family Medicine

## 2022-11-16 ENCOUNTER — Encounter: Payer: Self-pay | Admitting: Family Medicine

## 2022-11-16 VITALS — BP 110/68 | HR 70 | Temp 97.5°F | Ht 64.0 in | Wt 168.0 lb

## 2022-11-16 DIAGNOSIS — Z0000 Encounter for general adult medical examination without abnormal findings: Secondary | ICD-10-CM

## 2022-11-16 NOTE — Progress Notes (Signed)
Established Patient Office Visit   Subjective:  Patient ID: Jennifer Maxwell, female    DOB: 08-04-2001  Age: 22 y.o. MRN: [id number]  Chief Complaint  Patient presents with   Annual Exam    CPE, no concerns. Patient fasting.     HPI Encounter Diagnoses  Name Primary?   Healthcare maintenance Yes   For yearly health check she is fasting.  She continues to work in residential pain.  She does have access to dental care and goes for regular cleanings.  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  She would like to work in a [place] office.  She lives at home with her mother.   Review of Systems  Constitutional: Negative.   HENT: Negative.    Eyes:  Negative for blurred vision, discharge and redness.  Respiratory: Negative.    Cardiovascular: Negative.   Gastrointestinal:  Negative for abdominal pain.  Genitourinary: Negative.   Musculoskeletal: Negative.  Negative for myalgias.  Skin:  Negative for rash.  Neurological:  Negative for tingling, loss of consciousness and weakness.  Endo/Heme/Allergies:  Negative for polydipsia.     Current Outpatient Medications:    norelgestromin-ethinyl estradiol Marilu Favre) 150-35 MCG/24HR transdermal patch, APPLY 1 PATCH TOPICALLY TO THE SKIN 1 TIME A WEEK, Disp: 3 patch, Rfl: 1   Objective:     BP 110/68 (BP Location: Left Arm, Patient Position: Sitting, Cuff Size: Normal)   Pulse 70   Temp (!) 97.5 F (36.4 C) (Temporal)   Ht 5\' 4"  (1.626 m)   Wt 168 lb (76.2 kg)   LMP 10/26/2022 (Approximate)   SpO2 96%   BMI 28.84 kg/m    Physical Exam Constitutional:      General: She is not in acute distress.    Appearance: Normal appearance. She is not ill-appearing, toxic-appearing or diaphoretic.  HENT:     Head: Normocephalic and atraumatic.     Right Ear: External ear normal.     Left Ear: External ear normal.     Mouth/Throat:     Mouth:  Mucous membranes are moist.     Pharynx: Oropharynx is clear. No oropharyngeal exudate or posterior oropharyngeal erythema.  Eyes:     General: No scleral icterus.       Right eye: No discharge.        Left eye: No discharge.     Extraocular Movements: Extraocular movements intact.     Conjunctiva/sclera: Conjunctivae normal.     Pupils: Pupils are equal, round, and reactive to light.  Cardiovascular:     Rate and Rhythm: Normal rate and regular rhythm.  Pulmonary:     Effort: Pulmonary effort is normal. No respiratory distress.     Breath sounds: Normal breath sounds.  Abdominal:     General: Bowel sounds are normal.  Musculoskeletal:     Cervical back: No rigidity or tenderness.  Skin:    General: Skin is warm and dry.  Neurological:     Mental Status: She is alert and oriented to person, place, and time.  Psychiatric:        Mood and Affect: Mood normal.        Behavior: Behavior normal.      No results found for any visits on 11/16/22.    The ASCVD Risk score (Arnett DK, et al., 2019) failed to calculate for the following reasons:   The 2019 ASCVD  risk score is only valid for ages 59 to 57    Assessment & Plan:   Healthcare maintenance -     CBC -     Hemoglobin A1c -     Lipid panel -     Comprehensive metabolic panel -     Urinalysis, Routine w reflex microscopic    No follow-ups on file.  Refilled patient's oral contraceptives today.  Advised her that further refills will need to come from her new GYN provider.  She will seek out a new provider and let me know if she needs help with finding one.  Information was given on health maintenance and preventative care for her age group.  Libby Maw, MD

## 2022-11-17 LAB — URINALYSIS, ROUTINE W REFLEX MICROSCOPIC
Bilirubin Urine: NEGATIVE
Ketones, ur: NEGATIVE
Leukocytes,Ua: NEGATIVE
Nitrite: NEGATIVE
Specific Gravity, Urine: 1.01 (ref 1.000–1.030)
Total Protein, Urine: NEGATIVE
Urine Glucose: NEGATIVE
Urobilinogen, UA: 0.2 (ref 0.0–1.0)
pH: 7 (ref 5.0–8.0)

## 2022-11-17 LAB — LIPID PANEL
Cholesterol: 136 mg/dL (ref 0–200)
HDL: 46.9 mg/dL (ref >39.00)
LDL Cholesterol: 63 mg/dL (ref 0–99)
NonHDL: 89.25
Total CHOL/HDL Ratio: 3
Triglycerides: 129 mg/dL (ref 0.0–149.0)
VLDL: 25.8 mg/dL (ref 0.0–40.0)

## 2022-11-17 LAB — CBC
HCT: 41.7 % (ref 36.0–46.0)
Hemoglobin: 14 g/dL (ref 12.0–15.0)
MCHC: 33.6 g/dL (ref 30.0–36.0)
MCV: 89 fl (ref 78.0–100.0)
Platelets: 240 10*3/uL (ref 150.0–400.0)
RBC: 4.69 Mil/uL (ref 3.87–5.11)
RDW: 13.4 % (ref 11.5–15.5)
WBC: 9.2 10*3/uL (ref 4.0–10.5)

## 2022-11-17 LAB — COMPREHENSIVE METABOLIC PANEL
ALT: 26 U/L (ref 0–35)
AST: 20 U/L (ref 0–37)
Albumin: 4.4 g/dL (ref 3.5–5.2)
Alkaline Phosphatase: 49 U/L (ref 39–117)
BUN: 6 mg/dL (ref 6–23)
CO2: 25 mEq/L (ref 19–32)
Calcium: 9.5 mg/dL (ref 8.4–10.5)
Chloride: 103 mEq/L (ref 96–112)
Creatinine, Ser: 0.54 mg/dL (ref 0.40–1.20)
GFR: 129.84 mL/min (ref >60.00)
Glucose, Bld: 72 mg/dL (ref 70–99)
Potassium: 4 mEq/L (ref 3.5–5.1)
Sodium: 137 mEq/L (ref 135–145)
Total Bilirubin: 0.3 mg/dL (ref 0.2–1.2)
Total Protein: 7.3 g/dL (ref 6.0–8.3)

## 2022-11-17 LAB — HEMOGLOBIN A1C: Hgb A1c MFr Bld: 5.6 % (ref 4.6–6.5)

## 2022-11-26 ENCOUNTER — Encounter: Payer: Self-pay | Admitting: General Practice

## 2022-11-26 DIAGNOSIS — Z419 Encounter for procedure for purposes other than remedying health state, unspecified: Secondary | ICD-10-CM | POA: Diagnosis not present

## 2022-12-02 DIAGNOSIS — R070 Pain in throat: Secondary | ICD-10-CM | POA: Diagnosis not present

## 2022-12-02 DIAGNOSIS — R0981 Nasal congestion: Secondary | ICD-10-CM | POA: Diagnosis not present

## 2022-12-11 ENCOUNTER — Other Ambulatory Visit: Payer: Self-pay | Admitting: Family Medicine

## 2022-12-11 DIAGNOSIS — Z3041 Encounter for surveillance of contraceptive pills: Secondary | ICD-10-CM

## 2022-12-25 DIAGNOSIS — Z419 Encounter for procedure for purposes other than remedying health state, unspecified: Secondary | ICD-10-CM | POA: Diagnosis not present

## 2023-01-15 ENCOUNTER — Ambulatory Visit (INDEPENDENT_AMBULATORY_CARE_PROVIDER_SITE_OTHER): Payer: Medicaid Other | Admitting: Obstetrics and Gynecology

## 2023-01-15 ENCOUNTER — Encounter: Payer: Self-pay | Admitting: Obstetrics and Gynecology

## 2023-01-15 ENCOUNTER — Other Ambulatory Visit (HOSPITAL_COMMUNITY)
Admission: RE | Admit: 2023-01-15 | Discharge: 2023-01-15 | Disposition: A | Discharge status: Home / Self Care | Payer: Medicaid Other | Source: Ambulatory Visit | Attending: Obstetrics and Gynecology | Admitting: Obstetrics and Gynecology

## 2023-01-15 VITALS — BP 124/81 | HR 63 | Ht 64.0 in | Wt 166.0 lb

## 2023-01-15 DIAGNOSIS — Z01419 Encounter for gynecological examination (general) (routine) without abnormal findings: Secondary | ICD-10-CM | POA: Diagnosis not present

## 2023-01-15 DIAGNOSIS — Z3041 Encounter for surveillance of contraceptive pills: Secondary | ICD-10-CM | POA: Diagnosis not present

## 2023-01-15 MED ORDER — NORELGESTROMIN-ETH ESTRADIOL 150-35 MCG/24HR TD PTWK: 1.0000 | MEDICATED_PATCH | TRANSDERMAL | 6 refills | Status: DC

## 2023-01-15 NOTE — Patient Instructions (Signed)
Refills for your birth control sent to pharmacy - 3 months worth at a time   Pap Test Why am I having this test? A Pap test, also called a Pap smear, is a screening test to check for signs of: Infection. Cancer of the cervix. The cervix is the lower part of the uterus that opens into the vagina. Changes that may be a sign that cancer is developing (precancerous changes). Women need this test on a regular basis. In general, you should have a Pap test every 3 years until you reach menopause or age 24. Women aged 30-60 may choose to have their Pap test done at the same time as an HPV (human papillomavirus) test every 5 years (instead of every 3 years). Your health care provider may recommend having Pap tests more or less often depending on your medical conditions and past Pap test results. What is being tested? Cervical cells are tested for signs of infection or abnormalities. What kind of sample is taken?  Your health care provider will collect a sample of cells from the surface of your cervix. This will be done using a small cotton swab, plastic spatula, or brush that is inserted into your vagina using a tool called a speculum. This sample is often collected during a pelvic exam, when you are lying on your back on an exam table with your feet in footrests (stirrups). In some cases, fluids (secretions) from the cervix or vagina may also be collected. How do I prepare for this test? Be aware of where you are in your menstrual cycle. If you are menstruating on the day of the test, you may be asked to reschedule. You may need to reschedule if you have a known vaginal infection on the day of the test. Follow instructions from your health care provider about: Changing or stopping your regular medicines. Some medicines can cause abnormal test results, such as vaginal medicines and tetracycline. Avoiding douching 2-3 days before or the day of the test. Tell a health care provider about: Any allergies  you have. All medicines you are taking, including vitamins, herbs, eye drops, creams, and over-the-counter medicines. Any bleeding problems you have. Any surgeries you have had. Any medical conditions you have. Whether you are pregnant or may be pregnant. How are the results reported? Your test results will be reported as either abnormal or normal. What do the results mean? A normal test result means that you do not have signs of cancer of the cervix. An abnormal result may mean that you have: Cancer. A Pap test by itself is not enough to diagnose cancer. You will have more tests done if cancer is suspected. Precancerous changes in your cervix. Inflammation of the cervix. An STI (sexually transmitted infection). A fungal infection. A parasite infection. Talk with your health care provider about what your results mean. In some cases, your health care provider may do more testing to confirm the results. Questions to ask your health care provider Ask your health care provider, or the department that is doing the test: When will my results be ready? How will I get my results? What are my treatment options? What other tests do I need? What are my next steps? Summary In general, women should have a Pap test every 3 years until they reach menopause or age 8. Your health care provider will collect a sample of cells from the surface of your cervix. This will be done using a small cotton swab, plastic spatula, or brush. In  some cases, fluids (secretions) from the cervix or vagina may also be collected. This information is not intended to replace advice given to you by your health care provider. Make sure you discuss any questions you have with your health care provider. Document Revised: 01/10/2021 Document Reviewed: 01/10/2021 Elsevier Patient Education  [place].

## 2023-01-15 NOTE — Progress Notes (Signed)
ANNUAL EXAM Patient name: Jennifer Maxwell MRN [id number]  Date of birth: April 19, 2001 Chief Complaint:   Gynecologic Exam  History of Present Illness:   Jennifer Maxwell is a 22 y.o. . being seen today for a routine annual exam.  Current complaints: annual  Menstrual concerns? Yes   Breast or nipple changes? No  Contraception use? Yes patch, no issues Sexually active? Yes no pain with intercourse  No prior pap previously  Patient's last menstrual period was 12/24/2022 (within days).  Last pap not previously indicated Last mammogram: n/a Last colonoscopy: n/a     11/16/2022    2:59 PM 11/16/2022    2:26 PM 11/03/2021   10:53 AM 04/23/2021   10:55 AM 04/23/2021   10:03 AM  Depression screen PHQ 2/9  Decreased Interest 0 0 0 0 0  Down, Depressed, Hopeless 0 0 0 0 0  PHQ - 2 Score 0 0 0 0 0  Altered sleeping 1   0   Tired, decreased energy 0   1   Change in appetite 0   1   Feeling bad or failure about yourself  0   0   Trouble concentrating 0   0   Moving slowly or fidgety/restless 0   0   Suicidal thoughts 0   0   PHQ-9 Score 1   2   Difficult doing work/chores Not difficult at all   Somewhat difficult         11/16/2022    2:59 PM 04/23/2021   10:56 AM 12/28/2019    4:18 PM 12/08/2018    8:54 AM  GAD 7 : Generalized Anxiety Score  Nervous, Anxious, on Edge 1 0 0 0  Control/stop worrying 0 0 0 0  Worry too much - different things 1 0 0 0  Trouble relaxing 0 0 0 0  Restless 0 0 0 0  Easily annoyed or irritable 0 1 0 0  Afraid - awful might happen 0 0 0 0  Total GAD 7 Score 2 1 0 0  Anxiety Difficulty Not difficult at all Somewhat difficult       Review of Systems:   Pertinent items are noted in HPI Denies any headaches, blurred vision, fatigue, shortness of breath, chest pain, abdominal pain, abnormal vaginal discharge/itching/odor/irritation, problems with periods, bowel movements, urination, or intercourse unless otherwise stated  above. Pertinent History Reviewed:  Reviewed past medical,surgical, social and family history.  Reviewed problem list, medications and allergies. Physical Assessment:   Vitals:   01/15/23 1116  BP: 124/81  Pulse: 63  Weight: 166 lb (75.3 kg)  Height: 5\' 4"  (1.626 m)  Body mass index is 28.49 kg/m.        Physical Examination:   General appearance - well appearing, and in no distress  Mental status - alert, oriented to person, place, and time  Psych:  She has a normal mood and affect  Skin - warm and dry, normal color, no suspicious lesions noted  Chest - effort normal, all lung fields clear to auscultation bilaterally  Heart - normal rate and regular rhythm  Breasts - breasts appear normal, no suspicious masses, no skin or nipple changes or  axillary nodes  Abdomen - soft, nontender, nondistended, no masses or organomegaly  Pelvic -  VULVA: normal appearing vulva with no masses, tenderness or lesions   VAGINA: normal appearing vagina with normal color and discharge, no lesions   CERVIX: normal appearing cervix without discharge or lesions, no CMT  Thin prep pap is done with reflex HR HPV cotesting  UTERUS: uterus is felt to be normal size, shape, consistency and nontender   ADNEXA: No adnexal masses or tenderness noted.  Extremities:  No swelling or varicosities noted  Chaperone present for exam  No results found for this or any previous visit (from the past 24 hour(s)).    Assessment & Plan:  1. Well woman exam - Cervical cancer screening: Discussed screening Q3 years. Reviewed importance of annual exams and limits of pap smear. Pap with reflex HPV  - GC/CT: Discussed and recommended. Pt  declines - Gardasil: completed - Birth Control:  continue patch - Breast Health: Encouraged self breast awareness/exams.  - Follow-up: 12 months and prn  - Cytology - PAP( [facility])  2. Encounter for birth control pills maintenance Continue birth control patch at this time -  norelgestromin-ethinyl estradiol Marilu Favre) 150-35 MCG/24HR transdermal patch; Place 1 patch onto the skin once a week. Have 1 patch free week for menstrual cycle  Dispense: 9 patch; Refill: 6   Mammogram: @ 24yo, or sooner if problems Colonoscopy: @ 24yo, or sooner if problems  No orders of the defined types were placed in this encounter.   Meds: No orders of the defined types were placed in this encounter.   Follow-up: No follow-ups on file.  Darliss Cheney, MD 01/15/2023 11:21 AM

## 2023-01-19 LAB — CYTOLOGY - PAP: Diagnosis: NEGATIVE

## 2023-01-25 DIAGNOSIS — Z419 Encounter for procedure for purposes other than remedying health state, unspecified: Secondary | ICD-10-CM | POA: Diagnosis not present

## 2023-02-24 DIAGNOSIS — Z419 Encounter for procedure for purposes other than remedying health state, unspecified: Secondary | ICD-10-CM | POA: Diagnosis not present

## 2023-03-27 DIAGNOSIS — Z419 Encounter for procedure for purposes other than remedying health state, unspecified: Secondary | ICD-10-CM | POA: Diagnosis not present

## 2023-04-26 DIAGNOSIS — Z419 Encounter for procedure for purposes other than remedying health state, unspecified: Secondary | ICD-10-CM | POA: Diagnosis not present

## 2023-05-27 DIAGNOSIS — Z419 Encounter for procedure for purposes other than remedying health state, unspecified: Secondary | ICD-10-CM | POA: Diagnosis not present

## 2023-06-27 DIAGNOSIS — Z419 Encounter for procedure for purposes other than remedying health state, unspecified: Secondary | ICD-10-CM | POA: Diagnosis not present

## 2023-07-27 DIAGNOSIS — Z419 Encounter for procedure for purposes other than remedying health state, unspecified: Secondary | ICD-10-CM | POA: Diagnosis not present

## 2023-08-27 DIAGNOSIS — Z419 Encounter for procedure for purposes other than remedying health state, unspecified: Secondary | ICD-10-CM | POA: Diagnosis not present

## 2023-09-26 DIAGNOSIS — Z419 Encounter for procedure for purposes other than remedying health state, unspecified: Secondary | ICD-10-CM | POA: Diagnosis not present

## 2023-10-25 DIAGNOSIS — Z20822 Contact with and (suspected) exposure to covid-19: Secondary | ICD-10-CM | POA: Diagnosis not present

## 2023-10-25 DIAGNOSIS — J069 Acute upper respiratory infection, unspecified: Secondary | ICD-10-CM | POA: Diagnosis not present

## 2023-10-27 DIAGNOSIS — Z419 Encounter for procedure for purposes other than remedying health state, unspecified: Secondary | ICD-10-CM | POA: Diagnosis not present

## 2023-11-02 ENCOUNTER — Encounter: Payer: Self-pay | Admitting: Family Medicine

## 2023-11-02 ENCOUNTER — Ambulatory Visit: Payer: Self-pay | Admitting: Family Medicine

## 2023-11-02 ENCOUNTER — Telehealth: Payer: Medicaid Other | Admitting: Physician Assistant

## 2023-11-02 DIAGNOSIS — J189 Pneumonia, unspecified organism: Secondary | ICD-10-CM

## 2023-11-02 MED ORDER — AZITHROMYCIN 250 MG PO TABS: ORAL_TABLET | ORAL | 0 refills | Status: AC

## 2023-11-02 NOTE — Telephone Encounter (Addendum)
 CRM # [id number]  Red Word that prompted transfer to Nurse Triage: Patient has been ill for 9 days. Occasional cough.Chest pain, shortness of breath started lasted last Thurs. Began having lightheaded at work today. No fever at the time, but did have a low grade fever for 1 to 2 days at beginning of illness. Tried DayQuil and NyQuil. Is currently taking cough drops and teas, helping with sore throat from cough.    Chief Complaint: cold s/s & some SOB & discomfort when lying down  Symptoms: SOB and discomfort  w/inhalation and when lying down flat, fever last week and none now, cough,  Frequency: cold s/s since12/29/25 Pertinent Negatives: Patient denies n/a Disposition: [] ED /[] Urgent Care (no appt availability in office) / [x] Appointment(In office/virtual)/ []  [facility]/ [] Home Care/ [] Refused Recommended Disposition /[] [facility]/ []  Follow-up with PCP Additional Notes:pt has been sick for over a week, cough is getting worse, worse at night when lying down flat.  Also when lying flat some SOB and chest discomfort. No fever at present time.  Virtual appt made for today Reason for Disposition  Fever present > 3 days (72 hours)  Answer Assessment - Initial Assessment Questions 1. TEMPERATURE: What is the most recent temperature?  How was it measured?      102 last week but none now  2. ONSET: When did the fever start?      10/23/2024 3. CHILLS: Do you have chills? If yes: How bad are they?  (e.g., none, mild, moderate, severe)   - NONE: no chills   - MILD: feeling cold   - MODERATE: feeling very cold, some shivering (feels better under a thick blanket)   - SEVERE: feeling extremely cold with shaking chills (general body shaking, rigors; even under a thick blanket)      none 4. OTHER SYMPTOMS: Do you have any other symptoms besides the fever?  (e.g., abdomen pain, cough, diarrhea, earache, headache, sore throat, urination pain)     SOB, comfort with  inhalation, cough sometimes productive/green 5. CAUSE: If there are no symptoms, ask: What do you think is causing the fever?      unknown 6. CONTACTS: Does anyone else in the family have an infection? N/a 7. TREATMENT: What have you done so far to treat this fever? (e.g., medications)     N/a 8. IMMUNOCOMPROMISE: Do you have of the following: diabetes, HIV positive, splenectomy, cancer chemotherapy, chronic steroid treatment, transplant patient, etc.     no 9. PREGNANCY: Is there any chance you are pregnant? When was your last menstrual period?     no 10. TRAVEL: Have you traveled out of the country in the last month? (e.g., travel history, exposures)       no  Protocols used: [facility]

## 2023-11-02 NOTE — Patient Instructions (Signed)
 Jennifer Maxwell, thank you for joining Jennifer CHRISTELLA Dickinson, PA-C for today's virtual visit.  While this provider is not your primary care provider (PCP), if your PCP is located in our provider database this encounter information will be shared with them immediately following your visit.   A [facility] MyChart account gives you access to today's visit and all your visits, tests, and labs performed at [facility]  click here if you don't have a [facility] MyChart account or go to [email or web address]  Consent: (Patient) Jennifer Maxwell provided verbal consent for this virtual visit at the beginning of the encounter.  Current Medications:  Current Outpatient Medications:    azithromycin  (ZITHROMAX ) 250 MG tablet, Take 2 tablets on day 1, then 1 tablet daily on days 2 through 5, Disp: 6 tablet, Rfl: 0   norelgestromin -ethinyl estradiol  (XULANE ) 150-35 MCG/24HR transdermal patch, Place 1 patch onto the skin once a week. Have 1 patch free week for menstrual cycle, Disp: 9 patch, Rfl: 6   Medications ordered in this encounter:  Meds ordered this encounter  Medications   azithromycin  (ZITHROMAX ) 250 MG tablet    Sig: Take 2 tablets on day 1, then 1 tablet daily on days 2 through 5    Dispense:  6 tablet    Refill:  0    Supervising Provider:   LAMPTEY, PHILIP O [phone]     *If you need refills on other medications prior to your next appointment, please contact your pharmacy*  Follow-Up: Call back or seek an in-person evaluation if the symptoms worsen or if the condition fails to improve as anticipated.  [facility] [phone]  Other Instructions Community-Acquired Pneumonia, Adult Pneumonia is an infection of the lungs. It causes irritation and swelling in the airways of the lungs. Mucus and fluid may also build up inside the airways. This may cause coughing and trouble breathing. One type of pneumonia can happen while you are in a  hospital. A different type can happen when you are not in a hospital (community-acquired pneumonia). What are the causes?  This condition is caused by germs (viruses, bacteria, or fungi). Some types of germs can spread from person to person. Pneumonia is not thought to spread from person to person. What increases the risk? You have a long-term (chronic) disease, such as: Disease of the lungs. This may be chronic obstructive pulmonary disease (COPD) or asthma. Heart failure. Cystic fibrosis. Diabetes. Kidney disease. Sickle cell disease. HIV. You have other health problems, such as: Your body's defense system (immune system) is weak. A condition that may cause you to breathe in fluids from your mouth and nose. You had your spleen taken out. You do not take good care of your teeth and mouth (poor dental hygiene). You use or have used tobacco products. You go where the germs that cause this illness are common. You are older than 25 years of age. What are the signs or symptoms? A cough. A fever. Sweating or chills. Chest pain, often when you breathe deeply or cough. Breathing problems, such as: Fast breathing. Trouble breathing. Shortness of breath. Feeling tired (fatigued). Muscle aches. How is this treated? Treatment for this condition depends on many things, such as: The cause of your illness. Your medicines. Your other health problems. Most adults can be treated at home. Sometimes, treatment must happen in a hospital. Treatment may include medicines to kill germs. Medicines may depend on which germ caused your illness. Very bad pneumonia is rare. If  you get it, you may: Have a machine to help you breathe. Have fluid taken away from around your lungs. Follow these instructions at home: Medicines Take over-the-counter and prescription medicines only as told by your doctor. Take cough medicine only if you are losing sleep. Cough medicine can keep your body from taking mucus  away from your lungs. If you were prescribed antibiotics, take them as told by your doctor. Do not stop taking them even if you start to feel better. Lifestyle     Do not smoke or use any products that contain nicotine or tobacco. If you need help quitting, ask your doctor. Do not drink alcohol. Eat a healthy diet. This includes a lot of vegetables, fruits, whole grains, low-fat dairy products, and low-fat (lean) protein. General instructions  Rest a lot. Sleep for at least 8 hours each night. Sleep with your head and neck raised. Put a few pillows under your head or sleep in a reclining chair. Return to your normal activities as told by your doctor. Ask your doctor what activities are safe for you. Drink enough fluid to keep your pee (urine) pale yellow. If your throat is sore, gargle with a mixture of salt and water 3-4 times a day or as needed. To make salt water, completely dissolve -1 tsp (3-6 g) of salt in 1 cup (237 mL) of warm water. Keep all follow-up visits. How is this prevented? Getting the pneumonia shot (vaccine). These shots have different types and schedules. Ask your doctor what works best for you. Think about getting this shot if: You are older than 25 years of age. You are 33-67 years of age and: You are being treated for cancer. You have long-term lung disease. You have other problems that affect your body's defense system. Ask your doctor if you have one of these. Getting your flu shot every year. Ask your doctor which type of shot is best for you. Going to the dentist as often as told. Washing your hands often with soap and water for at least 20 seconds. If you cannot use soap and water, use hand sanitizer. Contact a doctor if: You have a fever. You lose sleep because your cough medicine does not help. Get help right away if: You are short of breath and this gets worse. You have more chest pain. Your sickness gets worse. This is very serious if: You are an  older adult. Your body's defense system is weak. You cough up blood. These symptoms may be an emergency. Get help right away. Call 911. Do not wait to see if the symptoms will go away. Do not drive yourself to the hospital. Summary Pneumonia is an infection of the lungs. Community-acquired pneumonia affects people who have not been in the hospital. Certain germs can cause this infection. This condition may be treated with medicines that kill germs. For very bad pneumonia, you may need a hospital stay and treatment to help with breathing. This information is not intended to replace advice given to you by your health care provider. Make sure you discuss any questions you have with your health care provider. Document Revised: 12/10/2021 Document Reviewed: 12/10/2021 Elsevier Patient Education  2024 Elsevier Inc.    If you have been instructed to have an in-person evaluation today at a local Urgent Care facility, please use the link below. It will take you to a list of all of our available [facility], including address, phone number and hours of operation. Please do not  delay care.  [facility]  If you or a family member do not have a primary care provider, use the link below to schedule a visit and establish care. When you choose a [facility] primary care physician or advanced practice provider, you gain a long-term partner in health. Find a Primary Care Provider  Learn more about [facility] in-office and virtual care options: [facility] - Get Care Now

## 2023-11-02 NOTE — Progress Notes (Signed)
 Virtual Visit Consent   Jennifer Maxwell, you are scheduled for a virtual visit with a [facility] provider today. Just as with appointments in the office, your consent must be obtained to participate. Your consent will be active for this visit and any virtual visit you may have with one of our providers in the next 365 days. If you have a MyChart account, a copy of this consent can be sent to you electronically.  As this is a virtual visit, video technology does not allow for your provider to perform a traditional examination. This may limit your provider's ability to fully assess your condition. If your provider identifies any concerns that need to be evaluated in person or the need to arrange testing (such as labs, EKG, etc.), we will make arrangements to do so. Although advances in technology are sophisticated, we cannot ensure that it will always work on either your end or our end. If the connection with a video visit is poor, the visit may have to be switched to a telephone visit. With either a video or telephone visit, we are not always able to ensure that we have a secure connection.  By engaging in this virtual visit, you consent to the provision of healthcare and authorize for your insurance to be billed (if applicable) for the services provided during this visit. Depending on your insurance coverage, you may receive a charge related to this service.  I need to obtain your verbal consent now. Are you willing to proceed with your visit today? Jennifer Maxwell has provided verbal consent on 11/02/2023 for a virtual visit (video or telephone). [name], PA-C  Date: 11/02/2023 5:42 PM  Virtual Visit via Video Note   I, [name], connected with  Jennifer Maxwell  ([id number], [birth year]) on 11/02/23 at  5:30 PM EST by a video-enabled telemedicine application and verified that I am speaking with the correct person using two  identifiers.  Location: Patient: Virtual Visit Location Patient: Home Provider: Virtual Visit Location Provider: Home Office   I discussed the limitations of evaluation and management by telemedicine and the availability of in person appointments. The patient expressed understanding and agreed to proceed.    History of Present Illness: Jennifer Maxwell is a 23 y.o. who identifies as a female who was assigned female at birth, and is being seen today for cough.  HPI: URI  This is a new problem. The current episode started 1 to 4 weeks ago (Over 10 days; seen at [facility] on 10/25/23, diagnosed Viral URI, covid negative and flu negative). The problem has been gradually worsening. There has been no fever. Associated symptoms include congestion, coughing, headaches (improved), rhinorrhea (improved), sinus pain (improved), a sore throat (improved) and vomiting (from coughing and dizziness). Pertinent negatives include no abdominal pain, chest pain, diarrhea, ear pain, nausea, plugged ear sensation or wheezing. Associated symptoms comments: Shortness of breath with activity, lightheaded, hurts to inhale, decreased appetite. Treatments tried: dayquil and nyquil for a week. The treatment provided no relief.     Problems:  Patient Active Problem List   Diagnosis Date Noted   Healthcare maintenance 11/16/2022   Postcoital UTI 05/14/2022   Flu vaccine need 11/03/2021   Encounter for birth control pills maintenance 11/03/2021   Hematuria 02/01/2020   Chronic right-sided low back pain without sciatica 02/01/2020    Allergies: No Known Allergies Medications:  Current Outpatient Medications:    azithromycin  (ZITHROMAX ) 250 MG tablet, Take 2 tablets on day 1,  then 1 tablet daily on days 2 through 5, Disp: 6 tablet, Rfl: 0   norelgestromin -ethinyl estradiol  (XULANE ) 150-35 MCG/24HR transdermal patch, Place 1 patch onto the skin once a week. Have 1 patch free week for menstrual cycle, Disp: 9 patch, Rfl:  6  Observations/Objective: Patient is well-developed, well-nourished in no acute distress.  Resting comfortably at home.  Head is normocephalic, atraumatic.  No labored breathing.  Speech is clear and coherent with logical content.  Patient is alert and oriented at baseline.    Assessment and Plan: 1. Pneumonia due to infectious organism, unspecified laterality, unspecified part of lung (Primary) - azithromycin  (ZITHROMAX ) 250 MG tablet; Take 2 tablets on day 1, then 1 tablet daily on days 2 through 5  Dispense: 6 tablet; Refill: 0  - Worsening over a week despite OTC medications - Will treat with Z-pack - Can continue Mucinex  - Push fluids.  - Rest.  - Steam and humidifier can help - Seek in person evaluation if worsening or symptoms fail to improve    Follow Up Instructions: I discussed the assessment and treatment plan with the patient. The patient was provided an opportunity to ask questions and all were answered. The patient agreed with the plan and demonstrated an understanding of the instructions.  A copy of instructions were sent to the patient via MyChart unless otherwise noted below.    The patient was advised to call back or seek an in-person evaluation if the symptoms worsen or if the condition fails to improve as anticipated.    [name], PA-C

## 2023-11-02 NOTE — Telephone Encounter (Signed)
 Patient  has a virtual appt today @  5:30 pm. Dm/cma

## 2023-11-02 NOTE — Telephone Encounter (Signed)
 This encounter was created in error - please disregard.

## 2023-11-27 DIAGNOSIS — Z419 Encounter for procedure for purposes other than remedying health state, unspecified: Secondary | ICD-10-CM | POA: Diagnosis not present

## 2023-12-24 ENCOUNTER — Telehealth: Payer: Medicaid Other | Admitting: Physician Assistant

## 2023-12-24 DIAGNOSIS — R3989 Other symptoms and signs involving the genitourinary system: Secondary | ICD-10-CM

## 2023-12-24 MED ORDER — NITROFURANTOIN MONOHYD MACRO 100 MG PO CAPS: 100.0000 mg | ORAL_CAPSULE | Freq: Two times a day (BID) | ORAL | 0 refills | Status: DC

## 2023-12-24 NOTE — Progress Notes (Signed)

## 2023-12-25 DIAGNOSIS — Z419 Encounter for procedure for purposes other than remedying health state, unspecified: Secondary | ICD-10-CM | POA: Diagnosis not present

## 2024-02-05 DIAGNOSIS — Z419 Encounter for procedure for purposes other than remedying health state, unspecified: Secondary | ICD-10-CM | POA: Diagnosis not present

## 2024-03-06 ENCOUNTER — Other Ambulatory Visit: Payer: Self-pay | Admitting: *Deleted

## 2024-03-06 DIAGNOSIS — Z419 Encounter for procedure for purposes other than remedying health state, unspecified: Secondary | ICD-10-CM | POA: Diagnosis not present

## 2024-03-06 DIAGNOSIS — Z3041 Encounter for surveillance of contraceptive pills: Secondary | ICD-10-CM

## 2024-03-06 MED ORDER — NORELGESTROMIN-ETH ESTRADIOL 150-35 MCG/24HR TD PTWK: 1.0000 | MEDICATED_PATCH | TRANSDERMAL | 6 refills | Status: AC

## 2024-03-19 ENCOUNTER — Telehealth: Admitting: Emergency Medicine

## 2024-03-19 DIAGNOSIS — R3989 Other symptoms and signs involving the genitourinary system: Secondary | ICD-10-CM

## 2024-03-19 MED ORDER — CEPHALEXIN 500 MG PO CAPS: 500.0000 mg | ORAL_CAPSULE | Freq: Two times a day (BID) | ORAL | 0 refills | Status: AC

## 2024-03-19 NOTE — Progress Notes (Addendum)
E-Visit for Urinary Problems ? ?We are sorry that you are not feeling well.  Here is how we plan to help! ? ?Based on what you shared with me it looks like you most likely have a simple urinary tract infection. ? ?A UTI (Urinary Tract Infection) is a bacterial infection of the bladder. ? ?Most cases of urinary tract infections are simple to treat but a key part of your care is to encourage you to drink plenty of fluids and watch your symptoms carefully. ? ?I have prescribed Keflex 500 mg twice a day for 7 days.  Your symptoms should gradually improve. Call us if the burning in your urine worsens, you develop worsening fever, back pain or pelvic pain or if your symptoms do not resolve after completing the antibiotic. ? ?Urinary tract infections can be prevented by drinking plenty of water to keep your body hydrated.  Also be sure when you wipe, wipe from front to back and don't hold it in!  If possible, empty your bladder every 4 hours. ? ?HOME CARE ?Drink plenty of fluids ?Compete the full course of the antibiotics even if the symptoms resolve ?Remember, when you need to go?go. Holding in your urine can increase the likelihood of getting a UTI! ?GET HELP RIGHT AWAY IF: ?You cannot urinate ?You get a high fever ?Worsening back pain occurs ?You see blood in your urine ?You feel sick to your stomach or throw up ?You feel like you are going to pass out ? ?MAKE SURE YOU  ?Understand these instructions. ?Will watch your condition. ?Will get help right away if you are not doing well or get worse. ? ? ?Thank you for choosing an e-visit. ? ?Your e-visit answers were reviewed by a board certified advanced clinical practitioner to complete your personal care plan. Depending upon the condition, your plan could have included both over the counter or prescription medications. ? ?Please review your pharmacy choice. Make sure the pharmacy is open so you can pick up prescription now. If there is a problem, you may contact your  provider through MyChart messaging and have the prescription routed to another pharmacy.  Your safety is important to us. If you have drug allergies check your prescription carefully.  ? ?For the next 24 hours you can use MyChart to ask questions about today's visit, request a non-urgent call back, or ask for a work or school excuse. ?You will get an email in the next two days asking about your experience. I hope that your e-visit has been valuable and will speed your recovery. ? ?I have spent 5 minutes in review of e-visit questionnaire, review and updating patient chart, medical decision making and response to patient.  ? ?Regena Delucchi, PhD, FNP-BC ?  ?

## 2024-04-06 DIAGNOSIS — Z419 Encounter for procedure for purposes other than remedying health state, unspecified: Secondary | ICD-10-CM | POA: Diagnosis not present

## 2024-04-25 DIAGNOSIS — K029 Dental caries, unspecified: Secondary | ICD-10-CM | POA: Diagnosis not present

## 2024-05-06 DIAGNOSIS — Z419 Encounter for procedure for purposes other than remedying health state, unspecified: Secondary | ICD-10-CM | POA: Diagnosis not present

## 2024-05-23 ENCOUNTER — Ambulatory Visit: Payer: Self-pay

## 2024-05-23 DIAGNOSIS — M545 Low back pain, unspecified: Secondary | ICD-10-CM | POA: Diagnosis not present

## 2024-05-23 NOTE — Telephone Encounter (Signed)
 FYI Only or Action Required?: FYI only for provider.  Patient was last seen in primary care on 11/16/2022 by Berneta Elsie Sayre, MD.  Called Nurse Triage reporting Back Pain.  Symptoms began yesterday.  Interventions attempted: OTC medications: Aleve , Icy Hot and Rest, hydration, or home remedies.  Symptoms are: unchanged.  Triage Disposition: See HCP Within 4 Hours (Or PCP Triage)-patient plans to be evaluated at Urgent Care.   Patient/caregiver understands and will follow disposition?: Yes  Copied from CRM [id number]. Topic: Clinical - Red Word Triage >> May 23, 2024  1:05 PM Suzen RAMAN wrote: Red Word that prompted transfer to Nurse Triage: back pain Reason for Disposition  [1] SEVERE back pain (e.g., excruciating, unable to do any normal activities) AND [2] not improved 2 hours after pain medicine  Answer Assessment - Initial Assessment Questions 1. ONSET: When did the pain begin? (e.g., minutes, hours, days)     Pain started yesterday 2. LOCATION: Where does it hurt? (upper, mid or lower back)     Lower back pain 3. SEVERITY: How bad is the pain?  (e.g., Scale 1-10; mild, moderate, or severe)     6 out of 10 4. PATTERN: Is the pain constant? (e.g., yes, no; constant, intermittent)      constant 5. RADIATION: Does the pain shoot into your legs or somewhere else?     No radiation 6. CAUSE:  What do you think is causing the back pain?      unsure 7. BACK OVERUSE:  Any recent lifting of heavy objects, strenuous work or exercise?     yes 8. MEDICINES: What have you taken so far for the pain? (e.g., nothing, acetaminophen, NSAIDS)     Aleve , Icy Hot 9. NEUROLOGIC SYMPTOMS: Do you have any weakness, numbness, or problems with bowel/bladder control?     no 10. OTHER SYMPTOMS: Do you have any other symptoms? (e.g., fever, abdomen pain, burning with urination, blood in urine)       no 11. PREGNANCY: Is there any chance you are pregnant? When was your last  menstrual period?       no  Protocols used: Back Pain-A-AH

## 2024-05-27 ENCOUNTER — Telehealth: Admitting: Nurse Practitioner

## 2024-05-27 DIAGNOSIS — R3989 Other symptoms and signs involving the genitourinary system: Secondary | ICD-10-CM | POA: Diagnosis not present

## 2024-05-27 MED ORDER — NITROFURANTOIN MONOHYD MACRO 100 MG PO CAPS: 100.0000 mg | ORAL_CAPSULE | Freq: Two times a day (BID) | ORAL | 0 refills | Status: AC

## 2024-05-27 NOTE — Progress Notes (Signed)
 I have spent 5 minutes in review of e-visit questionnaire, review and updating patient chart, medical decision making and response to patient.   Claiborne Rigg, NP

## 2024-05-27 NOTE — Progress Notes (Signed)
 Jennifer Maxwell,  As this is the 3rd time you are being treated for a UTI this year I would recommend you be seen in person the next time your sypmtoms occur so your urine can be tested as well as a vaginal swab. It is not common for someone your age to have frequent UTIs.   I have sent an antibiotic at this time to help with your symptoms    E-Visit for Urinary Problems  We are sorry that you are not feeling well.  Here is how we plan to help!  Based on what you shared with me it looks like you most likely have a simple urinary tract infection.  A UTI (Urinary Tract Infection) is a bacterial infection of the bladder.  Most cases of urinary tract infections are simple to treat but a key part of your care is to encourage you to drink plenty of fluids and watch your symptoms carefully.  I have prescribed MacroBid  100 mg twice a day for 5 days.  Your symptoms should gradually improve. Call us  if the burning in your urine worsens, you develop worsening fever, back pain or pelvic pain or if your symptoms do not resolve after completing the antibiotic.  Urinary tract infections can be prevented by drinking plenty of water to keep your body hydrated.  Also be sure when you wipe, wipe from front to back and don't hold it in!  If possible, empty your bladder every 4 hours.  HOME CARE Drink plenty of fluids Compete the full course of the antibiotics even if the symptoms resolve Remember, when you need to go.go. Holding in your urine can increase the likelihood of getting a UTI! GET HELP RIGHT AWAY IF: You cannot urinate You get a high fever Worsening back pain occurs You see blood in your urine You feel sick to your stomach or throw up You feel like you are going to pass out  MAKE SURE YOU  Understand these instructions. Will watch your condition. Will get help right away if you are not doing well or get worse.   Thank you for choosing an e-visit.  Your e-visit answers were reviewed by a  board certified advanced clinical practitioner to complete your personal care plan. Depending upon the condition, your plan could have included both over the counter or prescription medications.  Please review your pharmacy choice. Make sure the pharmacy is open so you can pick up prescription now. If there is a problem, you may contact your provider through [employer] and have the prescription routed to another pharmacy.  Your safety is important to us . If you have drug allergies check your prescription carefully.   For the next 24 hours you can use MyChart to ask questions about today's visit, request a non-urgent call back, or ask for a work or school excuse. You will get an email in the next two days asking about your experience. I hope that your e-visit has been valuable and will speed your recovery.

## 2024-05-30 ENCOUNTER — Emergency Department (HOSPITAL_BASED_OUTPATIENT_CLINIC_OR_DEPARTMENT_OTHER)

## 2024-05-30 ENCOUNTER — Other Ambulatory Visit: Payer: Self-pay

## 2024-05-30 ENCOUNTER — Emergency Department (HOSPITAL_BASED_OUTPATIENT_CLINIC_OR_DEPARTMENT_OTHER)
Admission: EM | Admit: 2024-05-30 | Discharge: 2024-05-30 | Disposition: A | Discharge status: Home / Self Care | Attending: Emergency Medicine | Admitting: Emergency Medicine

## 2024-05-30 DIAGNOSIS — Y990 Civilian activity done for income or pay: Secondary | ICD-10-CM | POA: Diagnosis not present

## 2024-05-30 DIAGNOSIS — K760 Fatty (change of) liver, not elsewhere classified: Secondary | ICD-10-CM | POA: Diagnosis not present

## 2024-05-30 DIAGNOSIS — R109 Unspecified abdominal pain: Secondary | ICD-10-CM | POA: Diagnosis not present

## 2024-05-30 DIAGNOSIS — M545 Low back pain, unspecified: Secondary | ICD-10-CM | POA: Diagnosis not present

## 2024-05-30 DIAGNOSIS — X500XXA Overexertion from strenuous movement or load, initial encounter: Secondary | ICD-10-CM | POA: Diagnosis not present

## 2024-05-30 DIAGNOSIS — K429 Umbilical hernia without obstruction or gangrene: Secondary | ICD-10-CM | POA: Diagnosis not present

## 2024-05-30 LAB — URINALYSIS, ROUTINE W REFLEX MICROSCOPIC
Bilirubin Urine: NEGATIVE
Glucose, UA: NEGATIVE mg/dL
Ketones, ur: NEGATIVE mg/dL
Leukocytes,Ua: NEGATIVE
Nitrite: NEGATIVE
Protein, ur: NEGATIVE mg/dL
Specific Gravity, Urine: <1.005 — ABNORMAL LOW (ref 1.005–1.030)
pH: 7 (ref 5.0–8.0)

## 2024-05-30 LAB — PREGNANCY, URINE: Preg Test, Ur: NEGATIVE

## 2024-05-30 MED ORDER — KETOROLAC TROMETHAMINE 60 MG/2ML IM SOLN
30.0000 mg | Freq: Once | INTRAMUSCULAR | Status: AC
  Administered 2024-05-30: 30 mg via INTRAMUSCULAR
  Filled 2024-05-30: qty 2

## 2024-05-30 NOTE — Discharge Instructions (Addendum)
 It was a pleasure taking care of you today.  As discussed, your CT imaging did not show any kidney stones.  I suspect your symptoms are likely related to a muscular strain. Your CT scan did show hepatic steatosis. Please follow-up with PCP for further evaluation.  Continue taking your pain medications as previously prescribed.  Return to the ER for any worsening symptoms.

## 2024-05-30 NOTE — ED Provider Notes (Signed)
 [place] [facility] AT [facility] Provider Note   CSN: [id number] Arrival date & time: 05/30/24  1149     Patient presents with: Back Pain   Jennifer Maxwell is a 23 y.o. female.   23 year old female with complaint of lower back pain, onset with lifting on 05/22/24. She was seen at urgent care, started on robaxin and voltaren. She returned to work several days later. Back pain exacerbated after another lifting episode at work. No current urinary symptoms, although she was started on macrobid  via an e-visit on 05/27/24. She denies fevers, chills. She does endorse occasional blood in urine.  The history is provided by the patient and medical records.  Back Pain Location:  Lumbar spine Quality:  Stiffness and aching Stiffness is present:  All day Radiates to:  Does not radiate Pain severity:  Moderate Pain is:  Same all the time Onset quality:  Sudden Duration:  1 week Timing:  Intermittent Progression:  Waxing and waning Chronicity:  New Context: lifting heavy objects   Ineffective treatments:  NSAIDs and muscle relaxants Associated symptoms: weakness   Associated symptoms: no abdominal pain, no bladder incontinence, no bowel incontinence, no dysuria, no fever, no numbness and no perianal numbness        Prior to Admission medications   Medication Sig Start Date End Date Taking? Authorizing Provider  diclofenac (VOLTAREN) 75 MG EC tablet Take 75 mg by mouth 2 (two) times daily. 05/23/24  Yes [provider]  methocarbamol (ROBAXIN) 500 MG tablet Take 500 mg by mouth 3 (three) times daily as needed. 05/23/24  Yes [provider]  nitrofurantoin , macrocrystal-monohydrate, (MACROBID ) 100 MG capsule Take 1 capsule (100 mg total) by mouth 2 (two) times daily. 05/27/24   Fleming, Zelda W, NP  norelgestromin -ethinyl estradiol  (XULANE ) 150-35 MCG/24HR transdermal patch Place 1 patch onto the skin once a week. Have 1 patch free week for  menstrual cycle 03/06/24   Ajewole, Christana, MD    Allergies: Patient has no known allergies.    Review of Systems  Constitutional:  Negative for fever.  Gastrointestinal:  Negative for abdominal pain and bowel incontinence.  Genitourinary:  Negative for bladder incontinence and dysuria.  Musculoskeletal:  Positive for back pain.  Neurological:  Positive for weakness. Negative for numbness.  All other systems reviewed and are negative.   Updated Vital Signs BP 135/89 (BP Location: Right Arm)   Pulse 82   Temp 98.4 F (36.9 C) (Oral)   Resp 18   SpO2 99%   Physical Exam Vitals and nursing note reviewed.  Constitutional:      Appearance: Normal appearance.  HENT:     Head: Normocephalic.     Nose: Nose normal.  Eyes:     Conjunctiva/sclera: Conjunctivae normal.  Cardiovascular:     Rate and Rhythm: Normal rate.  Pulmonary:     Effort: Pulmonary effort is normal.  Abdominal:     Tenderness: There is left CVA tenderness.  Musculoskeletal:        General: Normal range of motion.       Back:  Skin:    General: Skin is warm and dry.  Neurological:     General: No focal deficit present.     Mental Status: She is alert and oriented to person, place, and time.  Psychiatric:        Mood and Affect: Mood normal.        Behavior: Behavior normal.     (all labs ordered  are listed, but only abnormal results are displayed) Labs Reviewed  URINALYSIS, ROUTINE W REFLEX MICROSCOPIC - Abnormal; Notable for the following components:      Result Value   Color, Urine COLORLESS (*)    Specific Gravity, Urine <1.005 (*)    Hgb urine dipstick SMALL (*)    Bacteria, UA RARE (*)    All other components within normal limits  PREGNANCY, URINE    EKG: None  Radiology: No results found.   Procedures   Medications Ordered in the ED - No data to display                                  Medical Decision Making Amount and/or Complexity of Data Reviewed Labs:  ordered. Radiology: ordered.  Risk Prescription drug management.   Patient with low back pain. Seems to be MSK in origin, although ongoing for over a week without improvement with medication. Small amount of blood in urine, along with mild CVA tenderness. Renal study requested to evaluation for stone. Patient given toradol . Patient signed out to C. Aberman awaiting CT results.     Final diagnoses:  None    ED Discharge Orders     None          Claudene Lenis, NP 05/30/24 1328    Jerrol Agent, MD 05/30/24 1404

## 2024-05-30 NOTE — ED Provider Notes (Signed)
 Care assumed from Alm Sharps, NP.  See his note for full HPI.  In short, patient is 22 year old female who presents to the ED due to low back pain after heavy lifting on 7/28.  Is currently taking Robaxin and Voltaren.  Recently diagnosed with a UTI on 05/27/2024.  Plan from previous provider, follow-up on renal study.  If unremarkable patient can be discharged home.  Physical Exam  BP 104/84 (BP Location: Right Arm)   Pulse 72   Temp 98.4 F (36.9 C) (Oral)   Resp 16   SpO2 98%   Physical Exam Vitals and nursing note reviewed.  Constitutional:      General: She is not in acute distress.    Appearance: She is not ill-appearing.  HENT:     Head: Normocephalic.  Eyes:     Pupils: Pupils are equal, round, and reactive to light.  Cardiovascular:     Rate and Rhythm: Normal rate and regular rhythm.     Pulses: Normal pulses.     Heart sounds: Normal heart sounds. No murmur heard.    No friction rub. No gallop.  Pulmonary:     Effort: Pulmonary effort is normal.     Breath sounds: Normal breath sounds.  Abdominal:     General: Abdomen is flat. There is no distension.     Palpations: Abdomen is soft.     Tenderness: There is no abdominal tenderness. There is no guarding or rebound.  Musculoskeletal:        General: Normal range of motion.     Cervical back: Neck supple.  Skin:    General: Skin is warm and dry.  Neurological:     General: No focal deficit present.     Mental Status: She is alert.  Psychiatric:        Mood and Affect: Mood normal.        Behavior: Behavior normal.     Procedures  Procedures  ED Course / MDM    Medical Decision Making Amount and/or Complexity of Data Reviewed Labs: ordered. Radiology: ordered and independent interpretation performed. Decision-making details documented in ED Course.  Risk Prescription drug management.   CT renal study personally reviewed and interpreted which is negative for any acute abnormalities.  No kidney  stones.  Suspect likely musculoskeletal in nature.  Advised patient to continue taking her pain medication and muscle relaxer as previously prescribed.  Low back exercises given to patient at discharge.  Patient stable for discharge.  No evidence of cauda equina or central cord compression on exam. Strict ED precautions discussed with patient. Patient states understanding and agrees to plan. Patient discharged home in no acute distress and stable vitals    Lorelle Aleck JAYSON DEVONNA 05/30/24 1734    Jerrol Agent, MD 05/31/24 [phone]

## 2024-05-30 NOTE — ED Triage Notes (Signed)
 C/o lower back pain x 1 week. States she think she hurts her back when lifting.   Also started taking antibiotics since Sunday for possible UTI.

## 2024-06-06 DIAGNOSIS — Z419 Encounter for procedure for purposes other than remedying health state, unspecified: Secondary | ICD-10-CM | POA: Diagnosis not present

## 2024-07-07 DIAGNOSIS — Z419 Encounter for procedure for purposes other than remedying health state, unspecified: Secondary | ICD-10-CM | POA: Diagnosis not present

## 2024-08-20 DIAGNOSIS — N390 Urinary tract infection, site not specified: Secondary | ICD-10-CM | POA: Diagnosis not present

## 2024-08-20 DIAGNOSIS — R300 Dysuria: Secondary | ICD-10-CM | POA: Diagnosis not present

## 2024-09-06 DIAGNOSIS — Z419 Encounter for procedure for purposes other than remedying health state, unspecified: Secondary | ICD-10-CM | POA: Diagnosis not present

## 2024-10-06 DIAGNOSIS — Z419 Encounter for procedure for purposes other than remedying health state, unspecified: Secondary | ICD-10-CM | POA: Diagnosis not present

## 2024-10-24 DIAGNOSIS — N390 Urinary tract infection, site not specified: Secondary | ICD-10-CM | POA: Diagnosis not present
